# Patient Record
Sex: Female | Born: 1985 | Race: White | Hispanic: No | Marital: Single | Smoking: Current every day smoker
Health system: Southern US, Community
[De-identification: ages and names within clinical notes are randomized; demographics above are authoritative.]

## PROBLEM LIST (undated history)

## (undated) DIAGNOSIS — K8020 Calculus of gallbladder without cholecystitis without obstruction: Secondary | ICD-10-CM

## (undated) DIAGNOSIS — F32A Depression, unspecified: Secondary | ICD-10-CM

## (undated) DIAGNOSIS — F419 Anxiety disorder, unspecified: Secondary | ICD-10-CM

## (undated) DIAGNOSIS — F329 Major depressive disorder, single episode, unspecified: Secondary | ICD-10-CM

## (undated) HISTORY — PX: EAR TUBE REMOVAL: SHX1486

---

## 2013-10-25 ENCOUNTER — Other Ambulatory Visit: Payer: Self-pay | Admitting: *Deleted

## 2013-10-25 ENCOUNTER — Other Ambulatory Visit: Payer: Self-pay | Admitting: Obstetrics & Gynecology

## 2013-10-25 ENCOUNTER — Ambulatory Visit (INDEPENDENT_AMBULATORY_CARE_PROVIDER_SITE_OTHER): Payer: 59 | Admitting: Obstetrics & Gynecology

## 2013-10-25 ENCOUNTER — Encounter: Payer: Self-pay | Admitting: Obstetrics & Gynecology

## 2013-10-25 VITALS — BP 119/74 | HR 77 | Ht 63.0 in | Wt 138.0 lb

## 2013-10-25 DIAGNOSIS — O26899 Other specified pregnancy related conditions, unspecified trimester: Secondary | ICD-10-CM

## 2013-10-25 DIAGNOSIS — Z3490 Encounter for supervision of normal pregnancy, unspecified, unspecified trimester: Secondary | ICD-10-CM | POA: Insufficient documentation

## 2013-10-25 DIAGNOSIS — Z3480 Encounter for supervision of other normal pregnancy, unspecified trimester: Secondary | ICD-10-CM

## 2013-10-25 DIAGNOSIS — O3090 Multiple gestation, unspecified, unspecified trimester: Secondary | ICD-10-CM

## 2013-10-25 DIAGNOSIS — O360111 Maternal care for anti-D [Rh] antibodies, first trimester, fetus 1: Secondary | ICD-10-CM

## 2013-10-25 DIAGNOSIS — Z6791 Unspecified blood type, Rh negative: Secondary | ICD-10-CM | POA: Insufficient documentation

## 2013-10-25 DIAGNOSIS — Z3491 Encounter for supervision of normal pregnancy, unspecified, first trimester: Secondary | ICD-10-CM

## 2013-10-25 DIAGNOSIS — Z3481 Encounter for supervision of other normal pregnancy, first trimester: Secondary | ICD-10-CM

## 2013-10-25 DIAGNOSIS — O360990 Maternal care for other rhesus isoimmunization, unspecified trimester, not applicable or unspecified: Secondary | ICD-10-CM

## 2013-10-25 NOTE — Patient Instructions (Addendum)
First Trimester of Pregnancy The first trimester of pregnancy is from week 1 until the end of week 12 (months 1 through 3). A week after a sperm fertilizes an egg, the egg will implant on the wall of the uterus. This embryo will begin to develop into a baby. Genes from you and your partner are forming the baby. The female genes determine whether the baby is a boy or a girl. At 6-8 weeks, the eyes and face are formed, and the heartbeat can be seen on ultrasound. At the end of 12 weeks, all the baby's organs are formed.  Now that you are pregnant, you will want to do everything you can to have a healthy baby. Two of the most important things are to get good prenatal care and to follow your health care provider's instructions. Prenatal care is all the medical care you receive before the baby's birth. This care will help prevent, find, and treat any problems during the pregnancy and childbirth. BODY CHANGES Your body goes through many changes during pregnancy. The changes vary from woman to woman.   You may gain or lose a couple of pounds at first.  You may feel sick to your stomach (nauseous) and throw up (vomit). If the vomiting is uncontrollable, call your health care provider.  You may tire easily.  You may develop headaches that can be relieved by medicines approved by your health care provider.  You may urinate more often. Painful urination may mean you have a bladder infection.  You may develop heartburn as a result of your pregnancy.  You may develop constipation because certain hormones are causing the muscles that push waste through your intestines to slow down.  You may develop hemorrhoids or swollen, bulging veins (varicose veins).  Your breasts may begin to grow larger and become tender. Your nipples may stick out more, and the tissue that surrounds them (areola) may become darker.  Your gums may bleed and may be sensitive to brushing and flossing.  Dark spots or blotches  (chloasma, mask of pregnancy) may develop on your face. This will likely fade after the baby is born.  Your menstrual periods will stop.  You may have a loss of appetite.  You may develop cravings for certain kinds of food.  You may have changes in your emotions from day to day, such as being excited to be pregnant or being concerned that something may go wrong with the pregnancy and baby.  You may have more vivid and strange dreams.  You may have changes in your hair. These can include thickening of your hair, rapid growth, and changes in texture. Some women also have hair loss during or after pregnancy, or hair that feels dry or thin. Your hair will most likely return to normal after your baby is born. WHAT TO EXPECT AT YOUR PRENATAL VISITS During a routine prenatal visit:  You will be weighed to make sure you and the baby are growing normally.  Your blood pressure will be taken.  Your abdomen will be measured to track your baby's growth.  The fetal heartbeat will be listened to starting around week 10 or 12 of your pregnancy.  Test results from any previous visits will be discussed. Your health care provider may ask you:  How you are feeling.  If you are feeling the baby move.  If you have had any abnormal symptoms, such as leaking fluid, bleeding, severe headaches, or abdominal cramping.  If you have any questions. Other tests   that may be performed during your first trimester include:  Blood tests to find your blood type and to check for the presence of any previous infections. They will also be used to check for low iron levels (anemia) and Rh antibodies. Later in the pregnancy, blood tests for diabetes will be done along with other tests if problems develop.  Urine tests to check for infections, diabetes, or protein in the urine.  An ultrasound to confirm the proper growth and development of the baby.  An amniocentesis to check for possible genetic problems.  Fetal  screens for spina bifida and Down syndrome.  You may need other tests to make sure you and the baby are doing well. HOME CARE INSTRUCTIONS  Medicines  Follow your health care provider's instructions regarding medicine use. Specific medicines may be either safe or unsafe to take during pregnancy.  Take your prenatal vitamins as directed.  If you develop constipation, try taking a stool softener if your health care provider approves. Diet  Eat regular, well-balanced meals. Choose a variety of foods, such as meat or vegetable-based protein, fish, milk and low-fat dairy products, vegetables, fruits, and whole grain breads and cereals. Your health care provider will help you determine the amount of weight gain that is right for you.  Avoid raw meat and uncooked cheese. These carry germs that can cause birth defects in the baby.  Eating four or five small meals rather than three large meals a day may help relieve nausea and vomiting. If you start to feel nauseous, eating a few soda crackers can be helpful. Drinking liquids between meals instead of during meals also seems to help nausea and vomiting.  If you develop constipation, eat more high-fiber foods, such as fresh vegetables or fruit and whole grains. Drink enough fluids to keep your urine clear or pale yellow. Activity and Exercise  Exercise only as directed by your health care provider. Exercising will help you:  Control your weight.  Stay in shape.  Be prepared for labor and delivery.  Experiencing pain or cramping in the lower abdomen or low back is a good sign that you should stop exercising. Check with your health care provider before continuing normal exercises.  Try to avoid standing for long periods of time. Move your legs often if you must stand in one place for a long time.  Avoid heavy lifting.  Wear low-heeled shoes, and practice good posture.  You may continue to have sex unless your health care provider directs you  otherwise. Relief of Pain or Discomfort  Wear a good support bra for breast tenderness.   Take warm sitz baths to soothe any pain or discomfort caused by hemorrhoids. Use hemorrhoid cream if your health care provider approves.   Rest with your legs elevated if you have leg cramps or low back pain.  If you develop varicose veins in your legs, wear support hose. Elevate your feet for 15 minutes, 3-4 times a day. Limit salt in your diet. Prenatal Care  Schedule your prenatal visits by the twelfth week of pregnancy. They are usually scheduled monthly at first, then more often in the last 2 months before delivery.  Write down your questions. Take them to your prenatal visits.  Keep all your prenatal visits as directed by your health care provider. Safety  Wear your seat belt at all times when driving.  Make a list of emergency phone numbers, including numbers for family, friends, the hospital, and police and fire departments. General Tips    Ask your health care provider for a referral to a local prenatal education class. Begin classes no later than at the beginning of month 6 of your pregnancy.  Ask for help if you have counseling or nutritional needs during pregnancy. Your health care provider can offer advice or refer you to specialists for help with various needs.  Do not use hot tubs, steam rooms, or saunas.  Do not douche or use tampons or scented sanitary pads.  Do not cross your legs for long periods of time.  Avoid cat litter boxes and soil used by cats. These carry germs that can cause birth defects in the baby and possibly loss of the fetus by miscarriage or stillbirth.  Avoid all smoking, herbs, alcohol, and medicines not prescribed by your health care provider. Chemicals in these affect the formation and growth of the baby.  Schedule a dentist appointment. At home, brush your teeth with a soft toothbrush and be gentle when you floss. SEEK MEDICAL CARE IF:   You have  dizziness.  You have mild pelvic cramps, pelvic pressure, or nagging pain in the abdominal area.  You have persistent nausea, vomiting, or diarrhea.  You have a bad smelling vaginal discharge.  You have pain with urination.  You notice increased swelling in your face, hands, legs, or ankles. SEEK IMMEDIATE MEDICAL CARE IF:   You have a fever.  You are leaking fluid from your vagina.  You have spotting or bleeding from your vagina.  You have severe abdominal cramping or pain.  You have rapid weight gain or loss.  You vomit blood or material that looks like coffee grounds.  You are exposed to German measles and have never had them.  You are exposed to fifth disease or chickenpox.  You develop a severe headache.  You have shortness of breath.  You have any kind of trauma, such as from a fall or a car accident. Document Released: 01/20/2001 Document Revised: 06/12/2013 Document Reviewed: 12/06/2012 [employer] Patient Information 2015 [employer]. This information is not intended to replace advice given to you by your health care provider. Make sure you discuss any questions you have with your health care provider.  Contraception Choices Contraception (birth control) is the use of any methods or devices to prevent pregnancy. Below are some methods to help avoid pregnancy. HORMONAL METHODS   Contraceptive implant. This is a thin, plastic tube containing progesterone hormone. It does not contain estrogen hormone. Your health care provider inserts the tube in the inner part of the upper arm. The tube can remain in place for up to 3 years. After 3 years, the implant must be removed. The implant prevents the ovaries from releasing an egg (ovulation), thickens the cervical mucus to prevent sperm from entering the uterus, and thins the lining of the inside of the uterus.  Progesterone-only injections. These injections are given every 3 months by your health care provider to prevent  pregnancy. This synthetic progesterone hormone stops the ovaries from releasing eggs. It also thickens cervical mucus and changes the uterine lining. This makes it harder for sperm to survive in the uterus.  Birth control pills. These pills contain estrogen and progesterone hormone. They work by preventing the ovaries from releasing eggs (ovulation). They also cause the cervical mucus to thicken, preventing the sperm from entering the uterus. Birth control pills are prescribed by a health care provider.Birth control pills can also be used to treat heavy periods.  Minipill. This type of birth control pill contains   only the progesterone hormone. They are taken every day of each month and must be prescribed by your health care provider.  Birth control patch. The patch contains hormones similar to those in birth control pills. It must be changed once a week and is prescribed by a health care provider.  Vaginal ring. The ring contains hormones similar to those in birth control pills. It is left in the vagina for 3 weeks, removed for 1 week, and then a new one is put back in place. The patient must be comfortable inserting and removing the ring from the vagina.A health care provider's prescription is necessary.  Emergency contraception. Emergency contraceptives prevent pregnancy after unprotected sexual intercourse. This pill can be taken right after sex or up to 5 days after unprotected sex. It is most effective the sooner you take the pills after having sexual intercourse. Most emergency contraceptive pills are available without a prescription. Check with your pharmacist. Do not use emergency contraception as your only form of birth control. BARRIER METHODS   Female condom. This is a thin sheath (latex or rubber) that is worn over the penis during sexual intercourse. It can be used with spermicide to increase effectiveness.  Female condom. This is a soft, loose-fitting sheath that is put into the vagina  before sexual intercourse.  Diaphragm. This is a soft, latex, dome-shaped barrier that must be fitted by a health care provider. It is inserted into the vagina, along with a spermicidal jelly. It is inserted before intercourse. The diaphragm should be left in the vagina for 6 to 8 hours after intercourse.  Cervical cap. This is a round, soft, latex or plastic cup that fits over the cervix and must be fitted by a health care provider. The cap can be left in place for up to 48 hours after intercourse.  Sponge. This is a soft, circular piece of polyurethane foam. The sponge has spermicide in it. It is inserted into the vagina after wetting it and before sexual intercourse.  Spermicides. These are chemicals that kill or block sperm from entering the cervix and uterus. They come in the form of creams, jellies, suppositories, foam, or tablets. They do not require a prescription. They are inserted into the vagina with an applicator before having sexual intercourse. The process must be repeated every time you have sexual intercourse. INTRAUTERINE CONTRACEPTION  Intrauterine device (IUD). This is a T-shaped device that is put in a woman's uterus during a menstrual period to prevent pregnancy. There are 2 types:  Copper IUD. This type of IUD is wrapped in copper wire and is placed inside the uterus. Copper makes the uterus and fallopian tubes produce a fluid that kills sperm. It can stay in place for 10 years.  Hormone IUD. This type of IUD contains the hormone progestin (synthetic progesterone). The hormone thickens the cervical mucus and prevents sperm from entering the uterus, and it also thins the uterine lining to prevent implantation of a fertilized egg. The hormone can weaken or kill the sperm that get into the uterus. It can stay in place for 3-5 years, depending on which type of IUD is used. PERMANENT METHODS OF CONTRACEPTION  Female tubal ligation. This is when the woman's fallopian tubes are  surgically sealed, tied, or blocked to prevent the egg from traveling to the uterus.  Hysteroscopic sterilization. This involves placing a small coil or insert into each fallopian tube. Your doctor uses a technique called hysteroscopy to do the procedure. The device causes scar tissue   to form. This results in permanent blockage of the fallopian tubes, so the sperm cannot fertilize the egg. It takes about 3 months after the procedure for the tubes to become blocked. You must use another form of birth control for these 3 months.  Female sterilization. This is when the female has the tubes that carry sperm tied off (vasectomy).This blocks sperm from entering the vagina during sexual intercourse. After the procedure, the man can still ejaculate fluid (semen). NATURAL PLANNING METHODS  Natural family planning. This is not having sexual intercourse or using a barrier method (condom, diaphragm, cervical cap) on days the woman could become pregnant.  Calendar method. This is keeping track of the length of each menstrual cycle and identifying when you are fertile.  Ovulation method. This is avoiding sexual intercourse during ovulation.  Symptothermal method. This is avoiding sexual intercourse during ovulation, using a thermometer and ovulation symptoms.  Post-ovulation method. This is timing sexual intercourse after you have ovulated. Regardless of which type or method of contraception you choose, it is important that you use condoms to protect against the transmission of sexually transmitted infections (STIs). Talk with your health care provider about which form of contraception is most appropriate for you. Document Released: 01/26/2005 Document Revised: 01/31/2013 Document Reviewed: 07/21/2012 [facility] Patient Information 2015 [place], [place]. This information is not intended to replace advice given to you by your health care provider. Make sure you discuss any questions you have with your health care  provider. Thank you for enrolling in MyChart. Please follow the instructions below to securely access your online medical record. MyChart allows you to send messages to your doctor, view your test results, manage appointments, and more.   How Do I Sign Up? 1. In your Internet browser, go to [employer] and enter [email or web address]. 2. Click on the Sign Up Now link in the Sign In box. You will see the New Member Sign Up page. 3. Enter your MyChart Access Code exactly as it appears below. You will not need to use this code after you've completed the sign-up process. If you do not sign up before the expiration date, you must request a new code.  MyChart Access Code: [email or web address] Expires: 12/24/2013 10:15 AM  4. Enter your Social Security Number ([id number]) and Date of Birth (mm/dd/yyyy) as indicated and click Submit. You will be taken to the next sign-up page. 5. Create a MyChart ID. This will be your MyChart login ID and cannot be changed, so think of one that is secure and easy to remember. 6. Create a MyChart password. You can change your password at any time. 7. Enter your Password Reset Question and Answer. This can be used at a later time if you forget your password.  8. Enter your e-mail address. You will receive e-mail notification when new information is available in MyChart. 9. Click Sign Up. You can now view your medical record.   Additional Information Remember, MyChart is NOT to be used for urgent needs. For medical emergencies, dial 911.

## 2013-10-25 NOTE — Progress Notes (Signed)
Bedside U/S show single IUP - CRL: 25.77mm GA: [redacted]w[redacted]d FHR: 176bpm - Last Pap 05/05/12 normal.

## 2013-10-25 NOTE — Progress Notes (Signed)
    Subjective:    Danila Eddie is a 28 y.o. G2P0 at [redacted]w[redacted]d by clinic scan not consistent with LMP, being seen today for her first obstetrical visit.  Her obstetrical history is significant for being Rh negative, and having a term SVD in 2012 of a 6 lb 6 oz female infant. Patient does intend to breast feed. Pregnancy history fully reviewed.  Patient reports no significant complaints.  Filed Vitals:   10/25/13 0924 10/25/13 0925  BP: 119/74   Pulse: 77   Height:   (1.6 m)  Weight: 138 lb (62.596 kg)     HISTORY: OB History  Gravida Para Term Preterm AB SAB TAB Ectopic Multiple Living  2         1    # Outcome Date GA Lbr Len/2nd Weight Sex Delivery Anes PTL Lv  2 CUR           1 GRA 09/04/10 [redacted]w[redacted]d  6 lb 6 oz (2.892 kg) F SVD        History reviewed. No pertinent past medical history. Past Surgical History  Procedure Laterality Date  . Ear tube removal     Family History  Problem Relation Age of Onset  . Hypertension Mother   . Hypertension Maternal Grandmother   . Diabetes Paternal Grandmother   . Hyperlipidemia Maternal Grandmother      Exam    Uterus:     Pelvic Exam:    Perineum: No Hemorrhoids, Normal Perineum   Vulva: normal   Vagina:  normal mucosa, normal discharge   Cervix: multiparous appearance and no cervical motion tenderness   Adnexa: normal adnexa and no mass, fullness, tenderness   Bony Pelvis: average and proven to 6-6  System: Breast:  normal appearance, no masses or tenderness   Skin: normal coloration and turgor, no rashes   Neurologic: oriented, normal   Extremities: normal strength, tone, and muscle mass   HEENT PERRLA and extra ocular movement intact   Mouth/Teeth mucous membranes moist, pharynx normal without lesions and dental hygiene good   Neck supple and no masses   Cardiovascular: regular rate and rhythm   Respiratory:  appears well, vitals normal, no respiratory distress, acyanotic, normal RR, chest clear, no wheezing,  crepitations, rhonchi, normal symmetric air entry   Abdomen: soft, non-tender; bowel sounds normal; no masses,  no organomegaly   Urinary: urethral meatus normal      Assessment:    Pregnancy: G2P0 Patient Active Problem List   Diagnosis Date Noted  . Supervision of normal pregnancy 10/25/2013  . Rh negative, antepartum 10/25/2013     Plan:    Initial labs drawn. Continue prenatal vitamins. Problem list reviewed and updated. Genetic Screening discussed; she is interested in [place], information given to her.  She may get this at next visit. Ultrasound discussed; fetal survey: to be ordered later. The nature of [facility] with multiple MDs and other Advanced Practitioners was explained to patient; also emphasized that residents, students are part of our team. Follow up in 4 weeks. Routine obstetric precautions reviewed.   Tereso Newcomer, MD 10/25/2013

## 2013-10-26 ENCOUNTER — Telehealth: Payer: Self-pay | Admitting: *Deleted

## 2013-10-26 LAB — GC/CHLAMYDIA PROBE AMP
CT Probe RNA: NEGATIVE
GC Probe RNA: NEGATIVE

## 2013-10-26 LAB — OBSTETRIC PANEL
Antibody Screen: NEGATIVE
Basophils Absolute: 0 10*3/uL (ref 0.0–0.1)
Basophils Relative: 0 % (ref 0–1)
Eosinophils Absolute: 0.1 10*3/uL (ref 0.0–0.7)
Eosinophils Relative: 1 % (ref 0–5)
HCT: 38.9 % (ref 36.0–46.0)
Hemoglobin: 13 g/dL (ref 12.0–15.0)
Hepatitis B Surface Ag: NEGATIVE
Lymphocytes Relative: 14 % (ref 12–46)
Lymphs Abs: 1.4 10*3/uL (ref 0.7–4.0)
MCH: 31.8 pg (ref 26.0–34.0)
MCHC: 33.4 g/dL (ref 30.0–36.0)
MCV: 95.1 fL (ref 78.0–100.0)
Monocytes Absolute: 0.9 10*3/uL (ref 0.1–1.0)
Monocytes Relative: 9 % (ref 3–12)
Neutro Abs: 7.4 10*3/uL (ref 1.7–7.7)
Neutrophils Relative %: 76 % (ref 43–77)
Platelets: 161 10*3/uL (ref 150–400)
RBC: 4.09 MIL/uL (ref 3.87–5.11)
RDW: 13.2 % (ref 11.5–15.5)
Rh Type: NEGATIVE
Rubella: 2.04 Index — ABNORMAL HIGH (ref <0.90)
WBC: 9.8 10*3/uL (ref 4.0–10.5)

## 2013-10-26 LAB — HIV ANTIBODY (ROUTINE TESTING W REFLEX): HIV 1&2 Ab, 4th Generation: NONREACTIVE

## 2013-10-26 NOTE — Telephone Encounter (Signed)
Called pt to adv RhNeg as in previous pregnancy per pt request - Pt expressed understanding.

## 2013-10-27 LAB — CULTURE, OB URINE
Colony Count: NO GROWTH
ORGANISM ID, BACTERIA: NO GROWTH

## 2013-11-22 ENCOUNTER — Ambulatory Visit (INDEPENDENT_AMBULATORY_CARE_PROVIDER_SITE_OTHER): Payer: 59 | Admitting: Obstetrics & Gynecology

## 2013-11-22 ENCOUNTER — Encounter: Payer: Self-pay | Admitting: Obstetrics & Gynecology

## 2013-11-22 VITALS — Wt 141.0 lb

## 2013-11-22 DIAGNOSIS — Z23 Encounter for immunization: Secondary | ICD-10-CM

## 2013-11-22 DIAGNOSIS — Z3492 Encounter for supervision of normal pregnancy, unspecified, second trimester: Secondary | ICD-10-CM

## 2013-11-22 NOTE — Progress Notes (Signed)
Beside u/s shows CRL [id number]

## 2013-11-22 NOTE — Progress Notes (Signed)
Routine visit. No problems. She wants Panorama. Flu vaccine today. MSAFP at next visit.

## 2013-11-30 ENCOUNTER — Encounter: Payer: Self-pay | Admitting: *Deleted

## 2013-12-04 ENCOUNTER — Encounter: Payer: Self-pay | Admitting: Obstetrics & Gynecology

## 2013-12-12 ENCOUNTER — Encounter: Payer: Self-pay | Admitting: Obstetrics & Gynecology

## 2013-12-27 ENCOUNTER — Ambulatory Visit (INDEPENDENT_AMBULATORY_CARE_PROVIDER_SITE_OTHER): Payer: 59 | Admitting: Obstetrics & Gynecology

## 2013-12-27 ENCOUNTER — Encounter: Payer: Self-pay | Admitting: Obstetrics & Gynecology

## 2013-12-27 VITALS — BP 115/67 | HR 82 | Wt 142.0 lb

## 2013-12-27 DIAGNOSIS — Z36 Encounter for antenatal screening of mother: Secondary | ICD-10-CM

## 2013-12-27 DIAGNOSIS — Z3492 Encounter for supervision of normal pregnancy, unspecified, second trimester: Secondary | ICD-10-CM

## 2013-12-27 DIAGNOSIS — Z3482 Encounter for supervision of other normal pregnancy, second trimester: Secondary | ICD-10-CM

## 2013-12-27 NOTE — Progress Notes (Signed)
Routine visit. Good FM! No problems. AFP today. Schedule anatomy scan.

## 2013-12-28 LAB — ALPHA FETOPROTEIN, MATERNAL
AFP: 32.7 ng/mL
Curr Gest Age: 18.2 wks.days
MOM FOR AFP: 0.69
Open Spina bifida: NEGATIVE
Osb Risk: <1:27300 {titer}

## 2014-01-10 ENCOUNTER — Ambulatory Visit (HOSPITAL_COMMUNITY)
Admission: RE | Admit: 2014-01-10 | Discharge: 2014-01-10 | Disposition: A | Discharge status: Home / Self Care | Payer: 59 | Source: Ambulatory Visit | Attending: Obstetrics & Gynecology | Admitting: Obstetrics & Gynecology

## 2014-01-10 DIAGNOSIS — Z36 Encounter for antenatal screening of mother: Secondary | ICD-10-CM | POA: Diagnosis present

## 2014-01-10 DIAGNOSIS — Z3A20 20 weeks gestation of pregnancy: Secondary | ICD-10-CM | POA: Diagnosis not present

## 2014-01-10 DIAGNOSIS — Z3492 Encounter for supervision of normal pregnancy, unspecified, second trimester: Secondary | ICD-10-CM

## 2014-01-24 ENCOUNTER — Ambulatory Visit (INDEPENDENT_AMBULATORY_CARE_PROVIDER_SITE_OTHER): Payer: 59 | Admitting: Obstetrics & Gynecology

## 2014-01-24 VITALS — BP 103/67 | HR 68 | Wt 145.0 lb

## 2014-01-24 DIAGNOSIS — Z3492 Encounter for supervision of normal pregnancy, unspecified, second trimester: Secondary | ICD-10-CM

## 2014-01-24 NOTE — Progress Notes (Signed)
No concerns.  Nml anatomy.  Dates by early US.

## 2014-02-21 ENCOUNTER — Ambulatory Visit (INDEPENDENT_AMBULATORY_CARE_PROVIDER_SITE_OTHER): Payer: 59 | Admitting: Obstetrics & Gynecology

## 2014-02-21 VITALS — BP 111/70 | HR 73 | Wt 151.0 lb

## 2014-02-21 DIAGNOSIS — O360931 Maternal care for other rhesus isoimmunization, third trimester, fetus 1: Secondary | ICD-10-CM

## 2014-02-21 DIAGNOSIS — Z3490 Encounter for supervision of normal pregnancy, unspecified, unspecified trimester: Secondary | ICD-10-CM

## 2014-02-21 DIAGNOSIS — Z6741 Type O blood, Rh negative: Secondary | ICD-10-CM

## 2014-02-21 DIAGNOSIS — Z23 Encounter for immunization: Secondary | ICD-10-CM

## 2014-02-21 MED ORDER — TETANUS-DIPHTH-ACELL PERTUSSIS 5-2.5-18.5 LF-MCG/0.5 IM SUSP
0.5000 mL | Freq: Once | INTRAMUSCULAR | Status: AC
  Administered 2014-02-21: 0.5 mL via INTRAMUSCULAR

## 2014-02-21 NOTE — Progress Notes (Signed)
Routine visit. Good FM. Had spotting after sex, also has occasional nose bleeds. Cervix appears totally normal with no blood. Closed/thick/high. Rhogam today, 28 week labs at next visit along with TDAP.

## 2014-02-21 NOTE — Progress Notes (Signed)
Spotting after intercourse last night

## 2014-02-26 ENCOUNTER — Encounter (INDEPENDENT_AMBULATORY_CARE_PROVIDER_SITE_OTHER): Payer: Medicaid Other | Admitting: *Deleted

## 2014-02-26 DIAGNOSIS — Z3492 Encounter for supervision of normal pregnancy, unspecified, second trimester: Secondary | ICD-10-CM

## 2014-03-14 ENCOUNTER — Ambulatory Visit (INDEPENDENT_AMBULATORY_CARE_PROVIDER_SITE_OTHER): Payer: 59 | Admitting: Obstetrics & Gynecology

## 2014-03-14 ENCOUNTER — Other Ambulatory Visit: Payer: Self-pay | Admitting: Obstetrics & Gynecology

## 2014-03-14 VITALS — BP 119/70 | HR 95 | Wt 154.0 lb

## 2014-03-14 DIAGNOSIS — Z23 Encounter for immunization: Secondary | ICD-10-CM

## 2014-03-14 DIAGNOSIS — Z3493 Encounter for supervision of normal pregnancy, unspecified, third trimester: Secondary | ICD-10-CM

## 2014-03-14 DIAGNOSIS — Z36 Encounter for antenatal screening of mother: Secondary | ICD-10-CM

## 2014-03-14 LAB — CBC
HEMATOCRIT: 34.1 % — AB (ref 36.0–46.0)
Hemoglobin: 11.6 g/dL — ABNORMAL LOW (ref 12.0–15.0)
MCH: 32.5 pg (ref 26.0–34.0)
MCHC: 34 g/dL (ref 30.0–36.0)
MCV: 95.5 fL (ref 78.0–100.0)
MPV: 10 fL (ref 8.6–12.4)
Platelets: 150 10*3/uL (ref 150–400)
RBC: 3.57 MIL/uL — ABNORMAL LOW (ref 3.87–5.11)
RDW: 13 % (ref 11.5–15.5)
WBC: 9.7 10*3/uL (ref 4.0–10.5)

## 2014-03-14 MED ORDER — DTAP-HEPATITIS B RECOMB-IPV IM SUSP: 0.5000 mL | Freq: Once | INTRAMUSCULAR | Status: DC

## 2014-03-14 MED ORDER — TETANUS-DIPHTH-ACELL PERTUSSIS 5-2.5-18.5 LF-MCG/0.5 IM SUSP
0.5000 mL | Freq: Once | INTRAMUSCULAR | Status: AC
  Administered 2014-03-14: 0.5 mL via INTRAMUSCULAR

## 2014-03-15 ENCOUNTER — Telehealth: Payer: Self-pay | Admitting: *Deleted

## 2014-03-15 ENCOUNTER — Other Ambulatory Visit: Payer: Self-pay | Admitting: *Deleted

## 2014-03-15 DIAGNOSIS — Z3493 Encounter for supervision of normal pregnancy, unspecified, third trimester: Secondary | ICD-10-CM

## 2014-03-15 LAB — RPR

## 2014-03-15 LAB — HIV ANTIBODY (ROUTINE TESTING W REFLEX): HIV: NONREACTIVE

## 2014-03-15 LAB — GLUCOSE TOLERANCE, 1 HOUR (50G) W/O FASTING: GLUCOSE 1 HOUR GTT: 85 mg/dL (ref 70–140)

## 2014-03-15 NOTE — Telephone Encounter (Signed)
LM on voicemail of normal 1 hr GTT. 

## 2014-03-16 LAB — ANTIBODY SCREEN: ANTIBODY SCREEN: NEGATIVE

## 2014-03-16 MED ORDER — RHO D IMMUNE GLOBULIN 1500 UNIT/2ML IJ SOSY
300.0000 ug | PREFILLED_SYRINGE | Freq: Once | INTRAMUSCULAR | Status: AC
  Administered 2014-02-21: 300 ug via INTRAMUSCULAR

## 2014-03-16 NOTE — Addendum Note (Signed)
Addended by: Granville LewisLARK, Laniece Hornbaker L on: 03/16/2014 07:40 AM   Modules accepted: Orders

## 2014-03-28 ENCOUNTER — Encounter: Payer: 59 | Admitting: Obstetrics & Gynecology

## 2014-03-30 ENCOUNTER — Ambulatory Visit (INDEPENDENT_AMBULATORY_CARE_PROVIDER_SITE_OTHER): Payer: 59 | Admitting: Advanced Practice Midwife

## 2014-03-30 VITALS — BP 111/79 | HR 81 | Wt 155.0 lb

## 2014-03-30 DIAGNOSIS — O360111 Maternal care for anti-D [Rh] antibodies, first trimester, fetus 1: Secondary | ICD-10-CM

## 2014-03-30 DIAGNOSIS — Z3493 Encounter for supervision of normal pregnancy, unspecified, third trimester: Secondary | ICD-10-CM

## 2014-03-30 NOTE — Patient Instructions (Signed)
Third Trimester of Pregnancy The third trimester is from week 29 through week 42, months 7 through 9. The third trimester is a time when the fetus is growing rapidly. At the end of the ninth month, the fetus is about 20 inches in length and weighs 6-10 pounds.  BODY CHANGES Your body goes through many changes during pregnancy. The changes vary from woman to woman.   Your weight will continue to increase. You can expect to gain 25-35 pounds (11-16 kg) by the end of the pregnancy.  You may begin to get stretch marks on your hips, abdomen, and breasts.  You may urinate more often because the fetus is moving lower into your pelvis and pressing on your bladder.  You may develop or continue to have heartburn as a result of your pregnancy.  You may develop constipation because certain hormones are causing the muscles that push waste through your intestines to slow down.  You may develop hemorrhoids or swollen, bulging veins (varicose veins).  You may have pelvic pain because of the weight gain and pregnancy hormones relaxing your joints between the bones in your pelvis. Backaches may result from overexertion of the muscles supporting your posture.  You may have changes in your hair. These can include thickening of your hair, rapid growth, and changes in texture. Some women also have hair loss during or after pregnancy, or hair that feels dry or thin. Your hair will most likely return to normal after your baby is born.  Your breasts will continue to grow and be tender. A yellow discharge may leak from your breasts called colostrum.  Your belly button may stick out.  You may feel short of breath because of your expanding uterus.  You may notice the fetus "dropping," or moving lower in your abdomen.  You may have a bloody mucus discharge. This usually occurs a few days to a week before labor begins.  Your cervix becomes thin and soft (effaced) near your due date. WHAT TO EXPECT AT YOUR PRENATAL  EXAMS  You will have prenatal exams every 2 weeks until week 36. Then, you will have weekly prenatal exams. During a routine prenatal visit:  You will be weighed to make sure you and the fetus are growing normally.  Your blood pressure is taken.  Your abdomen will be measured to track your baby's growth.  The fetal heartbeat will be listened to.  Any test results from the previous visit will be discussed.  You may have a cervical check near your due date to see if you have effaced. At around 36 weeks, your caregiver will check your cervix. At the same time, your caregiver will also perform a test on the secretions of the vaginal tissue. This test is to determine if a type of bacteria, Group B streptococcus, is present. Your caregiver will explain this further. Your caregiver may ask you:  What your birth plan is.  How you are feeling.  If you are feeling the baby move.  If you have had any abnormal symptoms, such as leaking fluid, bleeding, severe headaches, or abdominal cramping.  If you have any questions. Other tests or screenings that may be performed during your third trimester include:  Blood tests that check for low iron levels (anemia).  Fetal testing to check the health, activity level, and growth of the fetus. Testing is done if you have certain medical conditions or if there are problems during the pregnancy. FALSE LABOR You may feel small, irregular contractions that   eventually go away. These are called Braxton Hicks contractions, or false labor. Contractions may last for hours, days, or even weeks before true labor sets in. If contractions come at regular intervals, intensify, or become painful, it is best to be seen by your caregiver.  SIGNS OF LABOR   Menstrual-like cramps.  Contractions that are 5 minutes apart or less.  Contractions that start on the top of the uterus and spread down to the lower abdomen and back.  A sense of increased pelvic pressure or back  pain.  A watery or bloody mucus discharge that comes from the vagina. If you have any of these signs before the 37th week of pregnancy, call your caregiver right away. You need to go to the hospital to get checked immediately. HOME CARE INSTRUCTIONS   Avoid all smoking, herbs, alcohol, and unprescribed drugs. These chemicals affect the formation and growth of the baby.  Follow your caregiver's instructions regarding medicine use. There are medicines that are either safe or unsafe to take during pregnancy.  Exercise only as directed by your caregiver. Experiencing uterine cramps is a good sign to stop exercising.  Continue to eat regular, healthy meals.  Wear a good support bra for breast tenderness.  Do not use hot tubs, steam rooms, or saunas.  Wear your seat belt at all times when driving.  Avoid raw meat, uncooked cheese, cat litter boxes, and soil used by cats. These carry germs that can cause birth defects in the baby.  Take your prenatal vitamins.  Try taking a stool softener (if your caregiver approves) if you develop constipation. Eat more high-fiber foods, such as fresh vegetables or fruit and whole grains. Drink plenty of fluids to keep your urine clear or pale yellow.  Take warm sitz baths to soothe any pain or discomfort caused by hemorrhoids. Use hemorrhoid cream if your caregiver approves.  If you develop varicose veins, wear support hose. Elevate your feet for 15 minutes, 3-4 times a day. Limit salt in your diet.  Avoid heavy lifting, wear low heal shoes, and practice good posture.  Rest a lot with your legs elevated if you have leg cramps or low back pain.  Visit your dentist if you have not gone during your pregnancy. Use a soft toothbrush to brush your teeth and be gentle when you floss.  A sexual relationship may be continued unless your caregiver directs you otherwise.  Do not travel far distances unless it is absolutely necessary and only with the approval  of your caregiver.  Take prenatal classes to understand, practice, and ask questions about the labor and delivery.  Make a trial run to the hospital.  Pack your hospital bag.  Prepare the baby's nursery.  Continue to go to all your prenatal visits as directed by your caregiver. SEEK MEDICAL CARE IF:  You are unsure if you are in labor or if your water has broken.  You have dizziness.  You have mild pelvic cramps, pelvic pressure, or nagging pain in your abdominal area.  You have persistent nausea, vomiting, or diarrhea.  You have a bad smelling vaginal discharge.  You have pain with urination. SEEK IMMEDIATE MEDICAL CARE IF:   You have a fever.  You are leaking fluid from your vagina.  You have spotting or bleeding from your vagina.  You have severe abdominal cramping or pain.  You have rapid weight loss or gain.  You have shortness of breath with chest pain.  You notice sudden or extreme swelling   of your face, hands, ankles, feet, or legs.  You have not felt your baby move in over an hour.  You have severe headaches that do not go away with medicine.  You have vision changes. Document Released: 01/20/2001 Document Revised: 01/31/2013 Document Reviewed: 03/29/2012 Exit[employer] Patient Information 2015 [employer]. This information is not intended to replace advice given to you by your health care provider. Make sure you discuss any questions you have with your health care provider.  

## 2014-03-30 NOTE — Progress Notes (Signed)
Doing well. No further spotting. Glucola = 85

## 2014-04-02 ENCOUNTER — Encounter: Payer: Self-pay | Admitting: *Deleted

## 2014-04-16 ENCOUNTER — Ambulatory Visit (INDEPENDENT_AMBULATORY_CARE_PROVIDER_SITE_OTHER): Payer: 59 | Admitting: Obstetrics & Gynecology

## 2014-04-16 VITALS — BP 110/71 | HR 106 | Wt 155.0 lb

## 2014-04-16 DIAGNOSIS — Z3493 Encounter for supervision of normal pregnancy, unspecified, third trimester: Secondary | ICD-10-CM | POA: Diagnosis not present

## 2014-04-16 NOTE — Progress Notes (Signed)
Having allergy symptoms.  Discussed claritin.Cultures next visit.  Needs waterbirth consent.

## 2014-04-29 ENCOUNTER — Inpatient Hospital Stay (HOSPITAL_COMMUNITY)
Admission: AD | Admit: 2014-04-29 | Discharge: 2014-05-01 | DRG: 775 | Disposition: A | Discharge status: Home / Self Care | Payer: 59 | Source: Ambulatory Visit | Attending: Obstetrics and Gynecology | Admitting: Obstetrics and Gynecology

## 2014-04-29 ENCOUNTER — Encounter (HOSPITAL_COMMUNITY): Payer: Self-pay | Admitting: *Deleted

## 2014-04-29 DIAGNOSIS — Z87891 Personal history of nicotine dependence: Secondary | ICD-10-CM

## 2014-04-29 DIAGNOSIS — Z8249 Family history of ischemic heart disease and other diseases of the circulatory system: Secondary | ICD-10-CM | POA: Diagnosis not present

## 2014-04-29 DIAGNOSIS — Z3A35 35 weeks gestation of pregnancy: Secondary | ICD-10-CM | POA: Diagnosis present

## 2014-04-29 DIAGNOSIS — Z833 Family history of diabetes mellitus: Secondary | ICD-10-CM

## 2014-04-29 LAB — CBC
HCT: 34.2 % — ABNORMAL LOW (ref 36.0–46.0)
Hemoglobin: 12 g/dL (ref 12.0–15.0)
MCH: 32.3 pg (ref 26.0–34.0)
MCHC: 35.1 g/dL (ref 30.0–36.0)
MCV: 91.9 fL (ref 78.0–100.0)
PLATELETS: 152 10*3/uL (ref 150–400)
RBC: 3.72 MIL/uL — AB (ref 3.87–5.11)
RDW: 12.8 % (ref 11.5–15.5)
WBC: 17 10*3/uL — AB (ref 4.0–10.5)

## 2014-04-29 LAB — TYPE AND SCREEN
ABO/RH(D): O NEG
Antibody Screen: NEGATIVE

## 2014-04-29 LAB — ABO/RH: ABO/RH(D): O NEG

## 2014-04-29 MED ORDER — BENZOCAINE-MENTHOL 20-0.5 % EX AERO: 1.0000 "application " | INHALATION_SPRAY | CUTANEOUS | Status: DC | PRN

## 2014-04-29 MED ORDER — SODIUM CHLORIDE 0.9 % IV SOLN: 250.0000 mL | INTRAVENOUS | Status: DC | PRN

## 2014-04-29 MED ORDER — LANOLIN HYDROUS EX OINT: TOPICAL_OINTMENT | CUTANEOUS | Status: DC | PRN

## 2014-04-29 MED ORDER — ONDANSETRON HCL 4 MG PO TABS: 4.0000 mg | ORAL_TABLET | ORAL | Status: DC | PRN

## 2014-04-29 MED ORDER — DIPHENHYDRAMINE HCL 25 MG PO CAPS: 25.0000 mg | ORAL_CAPSULE | Freq: Four times a day (QID) | ORAL | Status: DC | PRN

## 2014-04-29 MED ORDER — ACETAMINOPHEN 325 MG PO TABS: 650.0000 mg | ORAL_TABLET | ORAL | Status: DC | PRN

## 2014-04-29 MED ORDER — OXYTOCIN 40 UNITS IN LACTATED RINGERS INFUSION - SIMPLE MED: 62.5000 mL/h | INTRAVENOUS | Status: DC | PRN

## 2014-04-29 MED ORDER — TETANUS-DIPHTH-ACELL PERTUSSIS 5-2.5-18.5 LF-MCG/0.5 IM SUSP: 0.5000 mL | Freq: Once | INTRAMUSCULAR | Status: DC

## 2014-04-29 MED ORDER — IBUPROFEN 600 MG PO TABS
600.0000 mg | ORAL_TABLET | Freq: Four times a day (QID) | ORAL | Status: DC
  Administered 2014-04-29 – 2014-05-01 (×8): 600 mg via ORAL
  Filled 2014-04-29 (×7): qty 1

## 2014-04-29 MED ORDER — SIMETHICONE 80 MG PO CHEW
80.0000 mg | CHEWABLE_TABLET | ORAL | Status: DC | PRN
Start: 2014-04-29 — End: 2014-05-01

## 2014-04-29 MED ORDER — SENNOSIDES-DOCUSATE SODIUM 8.6-50 MG PO TABS
2.0000 | ORAL_TABLET | ORAL | Status: DC
  Administered 2014-04-29 – 2014-05-01 (×2): 2 via ORAL
  Filled 2014-04-29 (×2): qty 2

## 2014-04-29 MED ORDER — PRENATAL MULTIVITAMIN CH
1.0000 | ORAL_TABLET | Freq: Every day | ORAL | Status: DC
  Administered 2014-04-30: 1 via ORAL
  Filled 2014-04-29: qty 1

## 2014-04-29 MED ORDER — ONDANSETRON HCL 4 MG/2ML IJ SOLN
4.0000 mg | INTRAMUSCULAR | Status: DC | PRN
Start: 2014-04-29 — End: 2014-05-01

## 2014-04-29 MED ORDER — IBUPROFEN 600 MG PO TABS
600.0000 mg | ORAL_TABLET | Freq: Once | ORAL | Status: AC
  Administered 2014-04-29: 600 mg via ORAL
  Filled 2014-04-29: qty 1

## 2014-04-29 MED ORDER — SODIUM CHLORIDE 0.9 % IJ SOLN
3.0000 mL | Freq: Two times a day (BID) | INTRAMUSCULAR | Status: DC
Start: 2014-04-29 — End: 2014-05-01

## 2014-04-29 MED ORDER — OXYCODONE-ACETAMINOPHEN 5-325 MG PO TABS: 1.0000 | ORAL_TABLET | ORAL | Status: DC | PRN

## 2014-04-29 MED ORDER — OXYCODONE-ACETAMINOPHEN 5-325 MG PO TABS: 2.0000 | ORAL_TABLET | ORAL | Status: DC | PRN

## 2014-04-29 MED ORDER — WITCH HAZEL-GLYCERIN EX PADS: 1.0000 "application " | MEDICATED_PAD | CUTANEOUS | Status: DC | PRN

## 2014-04-29 MED ORDER — SODIUM CHLORIDE 0.9 % IJ SOLN: 3.0000 mL | INTRAMUSCULAR | Status: DC | PRN

## 2014-04-29 MED ORDER — DIBUCAINE 1 % RE OINT: 1.0000 "application " | TOPICAL_OINTMENT | RECTAL | Status: DC | PRN

## 2014-04-29 MED ORDER — OXYTOCIN 10 UNIT/ML IJ SOLN
10.0000 [IU] | Freq: Once | INTRAMUSCULAR | Status: AC
  Administered 2014-04-29: 10 [IU] via INTRAMUSCULAR
  Filled 2014-04-29 (×2): qty 1

## 2014-04-29 MED ORDER — ZOLPIDEM TARTRATE 5 MG PO TABS: 5.0000 mg | ORAL_TABLET | Freq: Every evening | ORAL | Status: DC | PRN

## 2014-04-29 NOTE — H&P (Signed)
Laurie Salinas is a 29 y.o. female presenting for birth in car on way to hospital. Arrived with pink, active female infant in arms. Placenta del spont. sm 1degree lac. No repair needed. Maternal Medical History:  Reason for admission: precip delivery in car on way to hospital. Arrived with pink, active new born female in arms.  Contractions: Onset was 6-12 hours ago.    Prenatal complications: no prenatal complications   OB History    Gravida Para Term Preterm AB TAB SAB Ectopic Multiple Living   2         1     No past medical history on file. Past Surgical History  Procedure Laterality Date  . Ear tube removal     Family History: family history includes Diabetes in her paternal grandmother; Hyperlipidemia in her maternal grandmother; Hypertension in her maternal grandmother and mother. Social History:  reports that she quit smoking about 8 months ago. She does not have any smokeless tobacco history on file. She reports that she does not drink alcohol or use illicit drugs.   Prenatal Transfer Tool  Maternal Diabetes: No Genetic Screening: Normal Maternal Ultrasounds/Referrals: Normal Fetal Ultrasounds or other Referrals:  None Maternal Substance Abuse:  No Significant Maternal Medications:  None Significant Maternal Lab Results:  None Other Comments:  None  Review of Systems  Constitutional: Negative.   HENT: Negative.   Eyes: Negative.   Respiratory: Negative.   Cardiovascular: Negative.   Gastrointestinal: Negative.   Genitourinary: Negative.   Musculoskeletal: Negative.   Skin: Negative.   Neurological: Negative.   Endo/Heme/Allergies: Negative.   Psychiatric/Behavioral: Negative.       Blood pressure 105/64, pulse 58, temperature 98.3 F (36.8 C), temperature source Oral, resp. rate 20, height 5\' 3"  (1.6 m), weight 155 lb (70.308 kg), last menstrual period 08/12/2013. Maternal Exam:  Abdomen: Patient reports no abdominal tenderness. Fundus firm, lochia sm-mod  amt  Introitus: Normal vulva. Normal vagina.    Physical Exam  Constitutional: She is oriented to person, place, and time. She appears well-developed and well-nourished.  HENT:  Head: Normocephalic.  Eyes: Pupils are equal, round, and reactive to light.  Neck: Normal range of motion.  Cardiovascular: Normal rate, regular rhythm, normal heart sounds and intact distal pulses.   Respiratory: Effort normal and breath sounds normal.  GI: Soft. Bowel sounds are normal.  Genitourinary: Vagina normal and uterus normal.  Musculoskeletal: Normal range of motion.  Neurological: She is alert and oriented to person, place, and time. She has normal reflexes.  Skin: Skin is warm and dry.  Psychiatric: She has a normal mood and affect. Her behavior is normal. Judgment and thought content normal.    Prenatal labs: ABO, Rh: O/NEG/-- (09/16 0948) Antibody: NEG (02/03 1006) Rubella: 2.04 (09/16 0948) RPR: NON REAC (02/03 1006)  HBsAg: NEGATIVE (09/16 0948)  HIV: NONREACTIVE (02/03 1006)  GBS:     Assessment/Plan: Stable preciptous del of viable fwmale at 35.[redacted] wks gestation.   Wyvonnia DuskyLAWSON, Karlita Lichtman DARLENE 04/29/2014, 8:31 AM

## 2014-04-29 NOTE — Progress Notes (Signed)
Lab in to draw blood.

## 2014-04-29 NOTE — Progress Notes (Signed)
Pt is a G2P2. Delivered her baby in the car herself. Nursery here to assess infant.

## 2014-04-29 NOTE — Progress Notes (Signed)
Camelia PhenesNatalie Frasier CNM here to assess pt for lacerations. No lacerations noted. Father of the baby at the bedside.

## 2014-04-30 ENCOUNTER — Encounter (HOSPITAL_COMMUNITY): Payer: Self-pay

## 2014-04-30 LAB — HIV ANTIBODY (ROUTINE TESTING W REFLEX): HIV Screen 4th Generation wRfx: NONREACTIVE

## 2014-04-30 LAB — RPR: RPR: NONREACTIVE

## 2014-04-30 MED ORDER — RHO D IMMUNE GLOBULIN 1500 UNIT/2ML IJ SOSY
300.0000 ug | PREFILLED_SYRINGE | Freq: Once | INTRAMUSCULAR | Status: AC
  Administered 2014-04-30: 300 ug via INTRAMUSCULAR
  Filled 2014-04-30: qty 2

## 2014-04-30 NOTE — Progress Notes (Signed)
Post Partum Day 1 Subjective: no complaints, up ad lib, voiding and tolerating PO  Objective: Blood pressure 110/64, pulse 63, temperature 98.2 F (36.8 C), temperature source Oral, resp. rate 18, height 5\' 3"  (1.6 m), weight 155 lb (70.308 kg), last menstrual period 08/12/2013, unknown if currently breastfeeding.  Physical Exam:  General: alert, cooperative, appears stated age and no distress Lochia: appropriate Uterine Fundus: firm Incision: n/a DVT Evaluation: No evidence of DVT seen on physical exam. Negative Homan's sign. No cords or calf tenderness. No significant calf/ankle edema.   Recent Labs  04/29/14 0925  HGB 12.0  HCT 34.2*    Assessment/Plan: Plan for discharge tomorrow   LOS: 1 day   Laurie Salinas DARLENE 04/30/2014, 7:17 AM

## 2014-04-30 NOTE — Lactation Note (Signed)
This note was copied from the chart of Laurie Salinas. Lactation Consultation Note Mom BF her 1st child for 1 month who is now 3 yrs. Old. Stated she had difficulty w/latching. This baby 35 6/7 weeks. Delivered in car. Appears to be ok. Noted this baby has poor suck coordination. Chomps at times, then suckles, slightly weak. Doesn't maintain tight seal. Has upper lip labial frenulum. Tongue appears to have limitation. Doesn't use the tongue to transfer.  Mom doesn't want to give bottle if she doesn't have to. Cup given for supplementing with after BF, hand express colostrum then subtract formula. Gave sheet and measuring cups.  Mom Rt. Breast slightly hypoplasia w/wide gap between breast. Hand expression w/colostrum noted. Denies PCOS. Encouraged to post-pump to stimulate the breast. Mom shown how to use DEBP & how to disassemble, clean, & reassemble parts. Mom knows to pump q3h for 15-20 min. Mom encouraged to do skin-to-skin.  Educated about LPI newborn behavior. Mom encouraged to waken baby for feeds. Mom encouraged to feed baby 8-12 times/24 hours and with feeding cues. Encouraged to call for assistance if needed and to verify proper latch. Hand expression taught to Mom. Referred to Baby and Me Book in Breastfeeding section Pg. 22-23 for position options and Proper latch demonstration. Encouraged comfort during BF so colostrum flows better and mom will enjoy the feeding longer. Taking deep breaths and breast massage during BF. WH/LC brochure given w/resources, support groups and LC services. Encouraged I&O. Baby BF well.   Patient Name: Laurie Shon HaleBrittany Tsutsui Today's Date: 04/30/2014 Reason for consult: Initial assessment   Maternal Data Has patient been taught Hand Expression?: Yes Does the patient have breastfeeding experience prior to this delivery?: Yes  Feeding Feeding Type: Formula Length of feed: 30 min  LATCH Score/Interventions Latch: Grasps breast easily, tongue down, lips  flanged, rhythmical sucking. Intervention(s): Skin to skin;Teach feeding cues;Waking techniques  Audible Swallowing: A few with stimulation Intervention(s): Skin to skin;Hand expression Intervention(s): Alternate breast massage;Hand expression  Type of Nipple: Everted at rest and after stimulation  Comfort (Breast/Nipple): Soft / non-tender     Hold (Positioning): Assistance needed to correctly position infant at breast and maintain latch. Intervention(s): Breastfeeding basics reviewed;Support Pillows;Position options;Skin to skin  LATCH Score: 8  Lactation Tools Discussed/Used Tools: Feeding cup;Pump Breast pump type: Double-Electric Breast Pump Pump Review: Setup, frequency, and cleaning;Milk Storage Initiated by:: Peri JeffersonL. Rajean Desantiago RN Date initiated:: 04/30/14   Consult Status Consult Status: Follow-up Date: 04/30/14 (in pm) Follow-up type: In-patient    Charyl DancerCARVER, [name] 04/30/2014, 4:56 AM

## 2014-04-30 NOTE — Lactation Note (Signed)
This note was copied from the chart of Girl GuernseyBrittany Klingensmith. Lactation Consultation Note  Patient Name: Girl Shon HaleBrittany Melito ZOXWR'UToday's Date: 04/30/2014 Reason for consult: Follow-up assessment Visited with Mom, baby 7832 hrs old.  Mom has been exclusively breast feeding and offering cup supplementation using expressed breast milk+/or formula.  Observed baby latching.  Mom has large in length and diameter nipples, and baby tends to latch onto nipple.  Mom interested in trying an SNS at the breast.  With assistance, baby became more nutritive.  Baby still not deep enough on breast, but suck pattern improved some.  Mom to use this as a tool.  Mom explained how to use and care for SNS.  Encouraged Mom to pump 15 minutes after breast feeding as much as she can.  Mom very tired.  To call for help prn.  Follow up in am.      Consult Status Consult Status: Follow-up Date: 05/01/14 Follow-up type: In-patient    Judee ClaraSmith, Carlen Fils E 04/30/2014, 4:30 PM

## 2014-05-01 ENCOUNTER — Encounter (HOSPITAL_COMMUNITY): Payer: Self-pay | Admitting: Obstetrics & Gynecology

## 2014-05-01 LAB — RH IG WORKUP (INCLUDES ABO/RH)
ABO/RH(D): O NEG
FETAL SCREEN: NEGATIVE
Gestational Age(Wks): 36
UNIT DIVISION: 0

## 2014-05-01 MED ORDER — IBUPROFEN 600 MG PO TABS: 600.0000 mg | ORAL_TABLET | Freq: Four times a day (QID) | ORAL | Status: DC

## 2014-05-01 MED ORDER — ACETAMINOPHEN 325 MG PO TABS: 650.0000 mg | ORAL_TABLET | ORAL | Status: DC | PRN

## 2014-05-01 NOTE — Lactation Note (Signed)
This note was copied from the chart of Girl GuernseyBrittany Kyllo. Lactation Consultation Note  Patient Name: Girl Shon HaleBrittany [place] ZOXWR'UToday's Date: 05/01/2014 Reason for consult: Follow-up assessment;Late preterm infant;Infant < 6lbs Parents demonstrated set up of SNS and latching baby independently at the breast. Mom reports if baby does not finish supplement at the breast after 30 minutes then she will cup feed the remaining supplement. Mom feels her breasts are starting to fill. She had pumped 3 ml this morning. She has a DEBP for home use. Mom does not want to use bottle to supplement at this time. Reviewed supplemental guidelines per LPT policy/handout. Encouraged to supplement with 20 ml of EBM/formula today increasing per guidelines. Reviewed with Mom limiting time at breast to minimize calorie usage with feedings. Gave parents double SNS and demonstrated how to set up/clean for use. Advised baby should be at the breast 8-12 times in 24 hours and with feeding ques. Continue to supplement using SNS unless feedings are taking more than 30-45 minutes total. Post pump to prevent engorgement and protect milk supply. Encouraged to pre-pump for 3-5 minutes once her milk is in so baby will get the higher fat milk when at the breast. Engorgement care reviewed if needed. Care for sore nipples reviewed, comfort gels given with instructions. OP f/u scheduled for Monday, 05/07/14 at 1:00.   Maternal Data    Feeding Feeding Type: Formula  LATCH Score/Interventions Latch: Grasps breast easily, tongue down, lips flanged, rhythmical sucking. Intervention(s): Breast massage  Audible Swallowing: A few with stimulation  Type of Nipple: Everted at rest and after stimulation  Comfort (Breast/Nipple): Filling, red/small blisters or bruises, mild/mod discomfort  Problem noted: Mild/Moderate discomfort Interventions (Mild/moderate discomfort): Hand massage;Hand expression  Hold (Positioning): Assistance needed to  correctly position infant at breast and maintain latch. Intervention(s): Breastfeeding basics reviewed  LATCH Score: 7  Lactation Tools Discussed/Used Tools: Pump;Feeding cup;Comfort gels;Supplemental Nutrition System Breast pump type: Double-Electric Breast Pump   Consult Status Consult Status: Complete Date: 05/01/14 Follow-up type: In-patient    Alfred LevinsGranger, Arless Vineyard Ann 05/01/2014, 9:01 AM

## 2014-05-01 NOTE — Discharge Summary (Signed)
Obstetric Discharge Summary Reason for Admission: Precipitous delivery in car Prenatal Procedures: none Intrapartum Procedures: none Postpartum Procedures: Rho(D) Ig Complications-Operative and Postpartum: 1st degree perineal laceration HEMOGLOBIN  Date Value Ref Range Status  04/29/2014 12.0 12.0 - 15.0 g/dL Final   HCT  Date Value Ref Range Status  04/29/2014 34.2* 36.0 - 46.0 % Final    Laurie Salinas is a 29 y.o. female who presented for birth  in car on way to hospital.  Arrived with pink, active female infant in arms sp delivery approx 0800 on 04/29/14. Placenta was delivered spontaneously.  1st degree laceration; no repair needed. She received rhophylac 3/21  Physical Exam:  General: alert, cooperative and icteric Lochia: appropriate Uterine Fundus: firm Incision: N/A DVT Evaluation: No evidence of DVT seen on physical exam.  Discharge Diagnoses: Preterm, delivered  Discharge Information: Date: 05/01/2014 Activity: pelvic rest Diet: routine Medications: Ibuprofen Condition: stable Instructions: refer to practice specific booklet Discharge to: home Follow-up Information    Follow up with [facility]. Schedule an appointment as soon as possible for a visit in 4 weeks.   Contact information:   [address] [place] [id number]       Newborn Data: Live born female  Birth Weight: 5 lb 3.8 oz (2375 g) APGAR: 9, 9  Home with mother. Feeding: breast Contraception: Mirena  Laurie Salinas 05/01/2014, 7:36 AM

## 2014-05-03 ENCOUNTER — Encounter: Payer: 59 | Admitting: Obstetrics & Gynecology

## 2014-05-07 ENCOUNTER — Ambulatory Visit (HOSPITAL_COMMUNITY): Admit: 2014-05-07 | Payer: 59

## 2014-05-10 ENCOUNTER — Encounter: Payer: 59 | Admitting: Obstetrics & Gynecology

## 2014-05-17 ENCOUNTER — Encounter: Payer: 59 | Admitting: Obstetrics & Gynecology

## 2014-05-24 ENCOUNTER — Encounter: Payer: 59 | Admitting: Obstetrics & Gynecology

## 2014-05-31 ENCOUNTER — Encounter: Payer: Self-pay | Admitting: Obstetrics & Gynecology

## 2014-05-31 ENCOUNTER — Ambulatory Visit (INDEPENDENT_AMBULATORY_CARE_PROVIDER_SITE_OTHER): Payer: 59 | Admitting: Obstetrics & Gynecology

## 2014-05-31 ENCOUNTER — Ambulatory Visit: Payer: 59 | Admitting: Obstetrics & Gynecology

## 2014-05-31 VITALS — BP 101/68 | HR 52 | Resp 16 | Ht 63.0 in | Wt 143.0 lb

## 2014-05-31 DIAGNOSIS — Z124 Encounter for screening for malignant neoplasm of cervix: Secondary | ICD-10-CM | POA: Diagnosis not present

## 2014-05-31 DIAGNOSIS — Z30430 Encounter for insertion of intrauterine contraceptive device: Secondary | ICD-10-CM

## 2014-05-31 DIAGNOSIS — Z0000 Encounter for general adult medical examination without abnormal findings: Secondary | ICD-10-CM

## 2014-05-31 MED ORDER — LEVONORGESTREL 20 MCG/24HR IU IUD
INTRAUTERINE_SYSTEM | Freq: Once | INTRAUTERINE | Status: AC
  Administered 2014-05-31: 16:00:00 via INTRAUTERINE

## 2014-05-31 NOTE — Progress Notes (Signed)
  Subjective:     Laurie Salinas is a 29 y.o. MW female who presents for a postpartum visit. She is 4 weeks postpartum following a spontaneous vaginal delivery. I have fully reviewed the prenatal and intrapartum course. The delivery was at 36 gestational weeks. Outcome: spontaneous vaginal delivery (precipitous in the passenger seat of the car while her husband drove to the hospital). Anesthesia: none. Postpartum course has been normal. Baby's course has been normal. Baby is feeding by both breast and bottle - Carn[employer] Good Start. Bleeding staining only. Bowel function is normal. Bladder function is normal. Patient is not sexually active. Contraception method is IUD. Postpartum depression screening: negative.  The following portions of the patient's history were reviewed and updated as appropriate: allergies, current medications, past family history, past medical history, past social history, past surgical history and problem list.  Review of Systems Pertinent items are noted in HPI.   Objective:    BP 101/68 mmHg  Pulse 52  Resp 16  Ht 5\' 3"  (1.6 m)  Wt 143 lb (64.864 kg)  BMI 25.34 kg/m2  Breastfeeding? Yes  General:  alert   Breasts:  inspection negative, no nipple discharge or bleeding, no masses or nodularity palpable  Lungs: clear to auscultation bilaterally  Heart:  regular rate and rhythm, S1, S2 normal, no murmur, click, rub or gallop  Abdomen: soft, non-tender; bowel sounds normal; no masses,  no organomegaly   Vulva:  normal  Vagina: normal vagina  Cervix:  anteverted  Corpus: normal  Adnexa:  normal adnexa  Rectal Exam: Not performed.         UPT negative, consent signed, Time out procedure done. Cervix prepped with betadine and grasped with a single tooth tenaculum. Mirena was easily placed and the strings were cut to 3-4 cm. Uterus sounded to 9 cm. She tolerated the procedure well.   Assessment:    Normalpostpartum exam. Pap smear done at today's visit.    Plan:    1. Contraception: IUD RTC 4 weeks for a string check/prn sooner

## 2014-06-04 LAB — CYTOLOGY - PAP

## 2014-06-28 ENCOUNTER — Ambulatory Visit: Payer: 59 | Admitting: Obstetrics & Gynecology

## 2014-06-28 DIAGNOSIS — Z30431 Encounter for routine checking of intrauterine contraceptive device: Secondary | ICD-10-CM

## 2015-06-11 ENCOUNTER — Encounter: Payer: Self-pay | Admitting: Obstetrics & Gynecology

## 2015-06-11 ENCOUNTER — Ambulatory Visit (INDEPENDENT_AMBULATORY_CARE_PROVIDER_SITE_OTHER): Payer: Medicaid Other | Admitting: Obstetrics & Gynecology

## 2015-06-11 ENCOUNTER — Ambulatory Visit (INDEPENDENT_AMBULATORY_CARE_PROVIDER_SITE_OTHER): Payer: Medicaid Other

## 2015-06-11 ENCOUNTER — Other Ambulatory Visit (HOSPITAL_COMMUNITY)
Admission: RE | Admit: 2015-06-11 | Discharge: 2015-06-11 | Disposition: A | Discharge status: Home / Self Care | Payer: Medicaid Other | Source: Ambulatory Visit | Attending: Obstetrics & Gynecology | Admitting: Obstetrics & Gynecology

## 2015-06-11 VITALS — BP 107/74 | HR 67 | Ht 63.0 in | Wt 129.0 lb

## 2015-06-11 DIAGNOSIS — Z113 Encounter for screening for infections with a predominantly sexual mode of transmission: Secondary | ICD-10-CM | POA: Insufficient documentation

## 2015-06-11 DIAGNOSIS — T8332XA Displacement of intrauterine contraceptive device, initial encounter: Secondary | ICD-10-CM | POA: Diagnosis not present

## 2015-06-11 DIAGNOSIS — Z30431 Encounter for routine checking of intrauterine contraceptive device: Secondary | ICD-10-CM | POA: Diagnosis not present

## 2015-06-11 DIAGNOSIS — Z7253 High risk bisexual behavior: Secondary | ICD-10-CM

## 2015-06-11 DIAGNOSIS — T8389XA Other specified complication of genitourinary prosthetic devices, implants and grafts, initial encounter: Secondary | ICD-10-CM

## 2015-06-11 MED ORDER — MISOPROSTOL 200 MCG PO TABS: ORAL_TABLET | ORAL | Status: DC

## 2015-06-11 NOTE — Progress Notes (Signed)
   Subjective:    Patient ID: Laurie Salinas, female    DOB: [birth year], 30 y.o.   MRN: [id number]  HPI  30 year old female presents for IUD evaluation. Patient never came for string check. She does have a monthly menses that is not too heavy and not bothersome she just wonders why she still has a period each month. She denies pelvic pain.  Review of Systems  Constitutional: Negative.   Gastrointestinal: Negative.   Genitourinary: Positive for vaginal bleeding. Negative for vaginal discharge, vaginal pain, menstrual problem and pelvic pain.       Objective:   Physical Exam  Constitutional: She appears well-developed and well-nourished. No distress.  HENT:  Head: Normocephalic and atraumatic.  Pulmonary/Chest: Effort normal.  Abdominal: Soft. She exhibits no distension and no mass. There is no tenderness. There is no rebound and no guarding.  Genitourinary: Vagina normal and uterus normal.  Pt menstruating Cervix no lesions No iud strings seen  Skin: Skin is warm and dry.  Psychiatric: She has a normal mood and affect.   Tried to grasp strings in lower cervix without success.      Assessment & Plan:  30 year old female with no IUD string seen. Bedside ultrasound questionably shows IUD in the lower uterine segment/endocervical canal. Difficult to tell with our ultrasound so we will send for official ultrasound   Addendum:  Measurements: 9.0 x 3.8 x 4.5 cm. No fibroids or other mass visualized.  Endometrium  Thickness: 2.2 mm. The upper endometrial canal is normal in appearance. The lower endometrial canal is remarkable for echogenic appearance, associated with posterior acoustic shadowing, likely representing the intrauterine device, lower than expected. No evidence that a portion of the IUD is in the myometrium.  Right ovary  Measurements: 3.2 x 2.1 x 3.1 cm. Normal appearance/no adnexal mass.  Left ovary  Measurements: 3.7 x 2.4 x 3.2 cm. Normal  appearance/no adnexal mass.  Other findings: No abnormal free fluid  IMPRESSION: 1. Suspect that the intrauterine device is within the lower uterine segment/endocervical canal. 2. Normal appearance of the ovaries.   Up to date: As a general rule, if the IUD is located in the lower uterine segment or near (but not at) the fundus, we tend to leave it in place as most IUDs will not be expelled, and some migrate to a more fundal position [25-27]. If an IUD is below the internal cervical os, we recommend removal because expulsion may follow, and may not be detected. Ideally, a new IUD will be placed or another highly effective contraceptive method will be initiated at the same visit in order to avoid an interruption in contraception.   We'll call patient with results to see if she wants to have the IUD removed removed to avoid unwanted expulsion.

## 2015-06-11 NOTE — Progress Notes (Signed)
Here today for routine IUD check, she never had it following insertion.  No concerns other that she does still have a monthly period.

## 2015-06-12 ENCOUNTER — Telehealth: Payer: Self-pay | Admitting: *Deleted

## 2015-06-12 LAB — HEPATITIS B SURFACE ANTIGEN: Hepatitis B Surface Ag: NEGATIVE

## 2015-06-12 LAB — HIV ANTIBODY (ROUTINE TESTING W REFLEX): HIV: NONREACTIVE

## 2015-06-12 LAB — HEPATITIS C ANTIBODY: HCV AB: NEGATIVE

## 2015-06-12 LAB — RPR

## 2015-06-12 NOTE — Telephone Encounter (Signed)
Pt notified of normal lab work. 

## 2015-06-13 LAB — CERVICOVAGINAL ANCILLARY ONLY
Chlamydia: NEGATIVE
Neisseria Gonorrhea: NEGATIVE

## 2015-06-24 ENCOUNTER — Ambulatory Visit (INDEPENDENT_AMBULATORY_CARE_PROVIDER_SITE_OTHER): Payer: Medicaid Other | Admitting: Obstetrics & Gynecology

## 2015-06-24 ENCOUNTER — Encounter: Payer: Self-pay | Admitting: Obstetrics & Gynecology

## 2015-06-24 VITALS — BP 99/66 | HR 73 | Ht 63.0 in | Wt 132.0 lb

## 2015-06-24 DIAGNOSIS — T8332XA Displacement of intrauterine contraceptive device, initial encounter: Secondary | ICD-10-CM | POA: Diagnosis not present

## 2015-06-24 DIAGNOSIS — Z30433 Encounter for removal and reinsertion of intrauterine contraceptive device: Secondary | ICD-10-CM | POA: Diagnosis not present

## 2015-06-24 NOTE — Progress Notes (Signed)
   Subjective:    Patient ID: Laurie HaleBrittany Salinas, female    DOB: March 21, 1985, 30 y.o.   MRN: [id number]  HPI 30 yo P2 here to have her 30 yo Mirena IUD removed. She had an u/s done that showed it to be in the lower uterine segment. She would like to restart OCPs.   Review of Systems     Objective:   Physical Exam [id number] Breathing, conversing, and ambulating normally The strings were not visible. I could not find the IUD with the uterine dressing forceps and the IUD hook. She tolerated the procedure well.       Assessment & Plan:  Malpositioned/retained IUD- plan for removal in the OR

## 2015-06-26 ENCOUNTER — Encounter (HOSPITAL_COMMUNITY): Payer: Self-pay | Admitting: *Deleted

## 2015-06-26 ENCOUNTER — Telehealth: Payer: Self-pay

## 2015-06-26 MED ORDER — NORGESTREL-ETHINYL ESTRADIOL 0.3-30 MG-MCG PO TABS: 1.0000 | ORAL_TABLET | Freq: Every day | ORAL | Status: AC

## 2015-06-26 NOTE — Telephone Encounter (Signed)
Patient called stating that no birth control was called in for her after her visit.  Called Dr. Marice Potterove and she would like Lo-ovral called in for patient. RX sent   Called patient back and made her aware of the prescription and that I had sent it in. Patient states understanding. Laurie StammerJennifer Douglass Dunshee RN BSN

## 2015-07-26 ENCOUNTER — Encounter (HOSPITAL_COMMUNITY): Payer: Self-pay | Admitting: *Deleted

## 2015-07-26 ENCOUNTER — Ambulatory Visit (HOSPITAL_COMMUNITY): Payer: Medicaid Other | Admitting: Certified Registered Nurse Anesthetist

## 2015-07-26 ENCOUNTER — Ambulatory Visit (HOSPITAL_COMMUNITY)
Admission: RE | Admit: 2015-07-26 | Discharge: 2015-07-26 | Disposition: A | Discharge status: Home / Self Care | Payer: Medicaid Other | Source: Ambulatory Visit | Attending: Obstetrics & Gynecology | Admitting: Obstetrics & Gynecology

## 2015-07-26 ENCOUNTER — Encounter (HOSPITAL_COMMUNITY)
Admission: RE | Disposition: A | Discharge status: Home / Self Care | Payer: Self-pay | Source: Ambulatory Visit | Attending: Obstetrics & Gynecology

## 2015-07-26 DIAGNOSIS — F329 Major depressive disorder, single episode, unspecified: Secondary | ICD-10-CM | POA: Diagnosis not present

## 2015-07-26 DIAGNOSIS — Z881 Allergy status to other antibiotic agents status: Secondary | ICD-10-CM | POA: Insufficient documentation

## 2015-07-26 DIAGNOSIS — Z30432 Encounter for removal of intrauterine contraceptive device: Secondary | ICD-10-CM

## 2015-07-26 DIAGNOSIS — F419 Anxiety disorder, unspecified: Secondary | ICD-10-CM | POA: Insufficient documentation

## 2015-07-26 DIAGNOSIS — F17200 Nicotine dependence, unspecified, uncomplicated: Secondary | ICD-10-CM | POA: Insufficient documentation

## 2015-07-26 DIAGNOSIS — Z880 Allergy status to penicillin: Secondary | ICD-10-CM | POA: Diagnosis not present

## 2015-07-26 DIAGNOSIS — Z79899 Other long term (current) drug therapy: Secondary | ICD-10-CM | POA: Insufficient documentation

## 2015-07-26 DIAGNOSIS — T8332XD Displacement of intrauterine contraceptive device, subsequent encounter: Secondary | ICD-10-CM | POA: Diagnosis not present

## 2015-07-26 HISTORY — DX: Calculus of gallbladder without cholecystitis without obstruction: K80.20

## 2015-07-26 HISTORY — DX: Anxiety disorder, unspecified: F41.9

## 2015-07-26 HISTORY — PX: HYSTEROSCOPY: SHX211

## 2015-07-26 HISTORY — DX: Depression, unspecified: F32.A

## 2015-07-26 HISTORY — DX: Major depressive disorder, single episode, unspecified: F32.9

## 2015-07-26 LAB — CBC
HCT: 40.1 % (ref 36.0–46.0)
Hemoglobin: 14 g/dL (ref 12.0–15.0)
MCH: 32 pg (ref 26.0–34.0)
MCHC: 34.9 g/dL (ref 30.0–36.0)
MCV: 91.8 fL (ref 78.0–100.0)
PLATELETS: 168 10*3/uL (ref 150–400)
RBC: 4.37 MIL/uL (ref 3.87–5.11)
RDW: 12.7 % (ref 11.5–15.5)
WBC: 7.6 10*3/uL (ref 4.0–10.5)

## 2015-07-26 LAB — PREGNANCY, URINE: PREG TEST UR: NEGATIVE

## 2015-07-26 SURGERY — HYSTEROSCOPY
Anesthesia: Monitor Anesthesia Care | Site: Vagina

## 2015-07-26 MED ORDER — LACTATED RINGERS IV SOLN
INTRAVENOUS | Status: DC
  Administered 2015-07-26: 13:00:00 via INTRAVENOUS

## 2015-07-26 MED ORDER — FENTANYL CITRATE (PF) 100 MCG/2ML IJ SOLN: 25.0000 ug | INTRAMUSCULAR | Status: DC | PRN

## 2015-07-26 MED ORDER — OXYCODONE-ACETAMINOPHEN 5-325 MG PO TABS: 1.0000 | ORAL_TABLET | Freq: Four times a day (QID) | ORAL | Status: AC | PRN

## 2015-07-26 MED ORDER — KETOROLAC TROMETHAMINE 30 MG/ML IJ SOLN
INTRAMUSCULAR | Status: AC
  Filled 2015-07-26: qty 1

## 2015-07-26 MED ORDER — SCOPOLAMINE 1 MG/3DAYS TD PT72
1.0000 | MEDICATED_PATCH | Freq: Once | TRANSDERMAL | Status: DC
  Administered 2015-07-26: 1.5 mg via TRANSDERMAL

## 2015-07-26 MED ORDER — FENTANYL CITRATE (PF) 100 MCG/2ML IJ SOLN
INTRAMUSCULAR | Status: AC
  Filled 2015-07-26: qty 2

## 2015-07-26 MED ORDER — IBUPROFEN 600 MG PO TABS: 600.0000 mg | ORAL_TABLET | Freq: Four times a day (QID) | ORAL | Status: AC | PRN

## 2015-07-26 MED ORDER — PROPOFOL 10 MG/ML IV BOLUS
INTRAVENOUS | Status: AC
  Filled 2015-07-26: qty 20

## 2015-07-26 MED ORDER — ONDANSETRON HCL 4 MG/2ML IJ SOLN
INTRAMUSCULAR | Status: DC | PRN
  Administered 2015-07-26: 4 mg via INTRAVENOUS

## 2015-07-26 MED ORDER — KETOROLAC TROMETHAMINE 30 MG/ML IJ SOLN
INTRAMUSCULAR | Status: DC | PRN
Start: 2015-07-26 — End: 2015-07-26
  Administered 2015-07-26: 30 mg via INTRAVENOUS

## 2015-07-26 MED ORDER — PROMETHAZINE HCL 25 MG/ML IJ SOLN: 6.2500 mg | INTRAMUSCULAR | Status: DC | PRN

## 2015-07-26 MED ORDER — LIDOCAINE HCL (CARDIAC) 20 MG/ML IV SOLN
INTRAVENOUS | Status: DC | PRN
  Administered 2015-07-26: 60 mg via INTRAVENOUS

## 2015-07-26 MED ORDER — BUPIVACAINE HCL (PF) 0.5 % IJ SOLN
INTRAMUSCULAR | Status: AC
  Filled 2015-07-26: qty 30

## 2015-07-26 MED ORDER — PROPOFOL 10 MG/ML IV BOLUS
INTRAVENOUS | Status: DC | PRN
  Administered 2015-07-26: 20 mg via INTRAVENOUS
  Administered 2015-07-26 (×2): 10 mg via INTRAVENOUS
  Administered 2015-07-26 (×2): 20 mg via INTRAVENOUS
  Administered 2015-07-26: 40 mg via INTRAVENOUS

## 2015-07-26 MED ORDER — DEXAMETHASONE SODIUM PHOSPHATE 10 MG/ML IJ SOLN
INTRAMUSCULAR | Status: DC | PRN
Start: 2015-07-26 — End: 2015-07-26
  Administered 2015-07-26: 4 mg via INTRAVENOUS

## 2015-07-26 MED ORDER — ONDANSETRON HCL 4 MG/2ML IJ SOLN
INTRAMUSCULAR | Status: AC
  Filled 2015-07-26: qty 2

## 2015-07-26 MED ORDER — SCOPOLAMINE 1 MG/3DAYS TD PT72
MEDICATED_PATCH | TRANSDERMAL | Status: AC
  Filled 2015-07-26: qty 1

## 2015-07-26 MED ORDER — DEXAMETHASONE SODIUM PHOSPHATE 4 MG/ML IJ SOLN
INTRAMUSCULAR | Status: AC
  Filled 2015-07-26: qty 1

## 2015-07-26 MED ORDER — MIDAZOLAM HCL 2 MG/2ML IJ SOLN
INTRAMUSCULAR | Status: DC | PRN
  Administered 2015-07-26: 2 mg via INTRAVENOUS

## 2015-07-26 MED ORDER — LIDOCAINE HCL (CARDIAC) 20 MG/ML IV SOLN
INTRAVENOUS | Status: AC
  Filled 2015-07-26: qty 5

## 2015-07-26 MED ORDER — BUPIVACAINE HCL (PF) 0.5 % IJ SOLN
INTRAMUSCULAR | Status: DC | PRN
  Administered 2015-07-26: 10 mL

## 2015-07-26 MED ORDER — FENTANYL CITRATE (PF) 100 MCG/2ML IJ SOLN
INTRAMUSCULAR | Status: DC | PRN
  Administered 2015-07-26: 100 ug via INTRAVENOUS

## 2015-07-26 MED ORDER — MIDAZOLAM HCL 2 MG/2ML IJ SOLN
INTRAMUSCULAR | Status: AC
  Filled 2015-07-26: qty 2

## 2015-07-26 SURGICAL SUPPLY — 19 items
BIPOLAR CUTTING LOOP 21FR (ELECTRODE)
CANISTER SUCT 3000ML (MISCELLANEOUS) ×2 IMPLANT
CATH ROBINSON RED A/P 16FR (CATHETERS) ×2 IMPLANT
CLOTH BEACON ORANGE TIMEOUT ST (SAFETY) ×2 IMPLANT
CONTAINER PREFILL 10% NBF 60ML (FORM) ×4 IMPLANT
DILATOR CANAL MILEX (MISCELLANEOUS) IMPLANT
ELECT REM PT RETURN 9FT ADLT (ELECTROSURGICAL)
ELECTRODE REM PT RTRN 9FT ADLT (ELECTROSURGICAL) IMPLANT
GLOVE BIO SURGEON STRL SZ 6.5 (GLOVE) ×2 IMPLANT
GLOVE BIOGEL PI IND STRL 7.0 (GLOVE) ×1 IMPLANT
GLOVE BIOGEL PI INDICATOR 7.0 (GLOVE) ×1
GOWN STRL REUS W/TWL LRG LVL3 (GOWN DISPOSABLE) ×4 IMPLANT
LOOP CUTTING BIPOLAR 21FR (ELECTRODE) IMPLANT
PACK VAGINAL MINOR WOMEN LF (CUSTOM PROCEDURE TRAY) ×2 IMPLANT
PAD OB MATERNITY 4.3X12.25 (PERSONAL CARE ITEMS) ×2 IMPLANT
TOWEL OR 17X24 6PK STRL BLUE (TOWEL DISPOSABLE) ×4 IMPLANT
TUBING AQUILEX INFLOW (TUBING) ×2 IMPLANT
TUBING AQUILEX OUTFLOW (TUBING) ×2 IMPLANT
WATER STERILE IRR 1000ML POUR (IV SOLUTION) ×2 IMPLANT

## 2015-07-26 NOTE — Discharge Instructions (Signed)

## 2015-07-26 NOTE — H&P (Signed)
Laurie Salinas is an 30 y.o. female SW P2 here to have her malpositioned IUD removed. She is on OCPs for the last 3 weeks.   Patient's last menstrual period was 06/05/2015 (approximate).    Past Medical History  Diagnosis Date  . SVD (spontaneous vaginal delivery)     x 2  . Gall stones   . Depression   . Anxiety     Past Surgical History  Procedure Laterality Date  . Ear tube removal      Family History  Problem Relation Age of Onset  . Hypertension Mother   . Hypertension Maternal Grandmother   . Diabetes Paternal Grandmother   . Hyperlipidemia Maternal Grandmother     Social History:  reports that she has been smoking Cigarettes.  She has been smoking about 0.50 packs per day. She has never used smokeless tobacco. She reports that she drinks alcohol. She reports that she does not use illicit drugs.  Allergies:  Allergies  Allergen Reactions  . Cefaclor Anaphylaxis    Laurie Salinas syndrome  . Penicillins Rash    Has patient had a PCN reaction causing immediate rash, facial/tongue/throat swelling, SOB or lightheadedness with hypotension: Yes Has patient had a PCN reaction causing severe rash involving mucus membranes or skin necrosis: Yes Has patient had a PCN reaction that required hospitalization Yes Has patient had a PCN reaction occurring within the last 10 years:   If all of the above answers are "NO", then may proceed with Cephalosporin use.     Prescriptions prior to admission  Medication Sig Dispense Refill Last Dose  . citalopram (CELEXA) 20 MG tablet take 1 tablet by mouth daily  1 07/26/2015 at Unknown time  . norgestrel-ethinyl estradiol (LO/OVRAL,CRYSELLE) 0.3-30 MG-MCG tablet Take 1 tablet by mouth daily. 1 Package 11 07/26/2015 at Unknown time  . CHANTIX STARTING MONTH PAK 0.5 MG X 11 & 1 MG X 42 tablet TK UTD  0 07/19/2015  . levonorgestrel (MIRENA) 20 MCG/24HR IUD 1 each by Intrauterine route once.   Taking  . misoprostol (CYTOTEC) 200 MCG tablet  Place 3 tablets into vagina the night prior to procedure 3 tablet 0 More than a month at Unknown time    ROS  Blood pressure 118/88, pulse 65, temperature 98.6 F (37 C), temperature source Oral, resp. rate 18, height 5\' 3"  (1.6 m), weight 59.875 kg (132 lb), last menstrual period 06/05/2015, SpO2 100 %, not currently breastfeeding. Physical Exam  Heart- rrr Lungs- CTAB Abd- benign  Results for orders placed or performed during the hospital encounter of 07/26/15 (from the past 24 hour(s))  CBC     Status: None   Collection Time: 07/26/15 12:25 PM  Result Value Ref Range   WBC 7.6 4.0 - 10.5 K/uL   RBC 4.37 3.87 - 5.11 MIL/uL   Hemoglobin 14.0 12.0 - 15.0 g/dL   HCT 16.140.1 09.636.0 - 04.546.0 %   MCV 91.8 78.0 - 100.0 fL   MCH 32.0 26.0 - 34.0 pg   MCHC 34.9 30.0 - 36.0 g/dL   RDW 40.912.7 81.111.5 - 91.415.5 %   Platelets 168 150 - 400 K/uL  Pregnancy, urine     Status: None   Collection Time: 07/26/15 12:30 PM  Result Value Ref Range   Preg Test, Ur NEGATIVE NEGATIVE    No results found.  Assessment/Plan: Retained IUD- plan for removal in the OR as this was not possible in the office  Tom Ragsdale C Sharrell Krawiec 07/26/2015, 1:05 PM

## 2015-07-26 NOTE — Anesthesia Postprocedure Evaluation (Signed)
Anesthesia Post Note  Patient: Laurie Salinas  Procedure(s) Performed: Procedure(s) (LRB): HYSTEROSCOPY (N/A)  Patient location during evaluation: PACU Anesthesia Type: MAC Level of consciousness: awake and alert Pain management: pain level controlled Vital Signs Assessment: post-procedure vital signs reviewed and stable Respiratory status: spontaneous breathing, nonlabored ventilation, respiratory function stable and patient connected to nasal cannula oxygen Cardiovascular status: stable and blood pressure returned to baseline Anesthetic complications: no     Last Vitals:  Filed Vitals:   07/26/15 1445 07/26/15 1446  BP: 109/74   Pulse: 57 57  Temp:  36.9 C  Resp: 11 16    Last Pain: There were no vitals filed for this visit. Pain Goal: Patients Stated Pain Goal: 3 (07/26/15 1237)               Branae Crail J

## 2015-07-26 NOTE — Transfer of Care (Signed)
Immediate Anesthesia Transfer of Care Note  Patient: Laurie Salinas  Procedure(s) Performed: Procedure(s) with comments: HYSTEROSCOPY (N/A) - IUD removal using hysteroscopy  Patient Location: PACU  Anesthesia Type:MAC  Level of Consciousness: awake, alert , oriented and patient cooperative  Airway & Oxygen Therapy: Patient Spontanous Breathing and Patient connected to nasal cannula oxygen  Post-op Assessment: Report given to RN and Post -op Vital signs reviewed and stable  Post vital signs: Reviewed and stable  Last Vitals:  Filed Vitals:   07/26/15 1237  BP: 118/88  Pulse: 65  Temp: 37 C  Resp: 18    Last Pain: There were no vitals filed for this visit.    Patients Stated Pain Goal: 3 (07/26/15 1237)  Complications: No apparent anesthesia complications

## 2015-07-26 NOTE — Anesthesia Preprocedure Evaluation (Signed)
Anesthesia Evaluation  Patient identified by MRN, date of birth, ID band Patient awake    Reviewed: Allergy & Precautions, NPO status , Patient's Chart, lab work & pertinent test results  Airway Mallampati: II  TM Distance: >3 FB Neck ROM: Full    Dental no notable dental hx.    Pulmonary Current Smoker,    Pulmonary exam normal breath sounds clear to auscultation       Cardiovascular negative cardio ROS Normal cardiovascular exam Rhythm:Regular Rate:Normal     Neuro/Psych PSYCHIATRIC DISORDERS Anxiety Depression negative neurological ROS     GI/Hepatic negative GI ROS, Neg liver ROS,   Endo/Other  negative endocrine ROS  Renal/GU negative Renal ROS  negative genitourinary   Musculoskeletal negative musculoskeletal ROS (+)   Abdominal   Peds negative pediatric ROS (+)  Hematology negative hematology ROS (+)   Anesthesia Other Findings   Reproductive/Obstetrics negative OB ROS                             Anesthesia Physical Anesthesia Plan  ASA: II  Anesthesia Plan: MAC   Post-op Pain Management:    Induction: Intravenous  Airway Management Planned: Natural Airway  Additional Equipment:   Intra-op Plan:   Post-operative Plan:   Informed Consent: I have reviewed the patients History and Physical, chart, labs and discussed the procedure including the risks, benefits and alternatives for the proposed anesthesia with the patient or authorized representative who has indicated his/her understanding and acceptance.   Dental advisory given  Plan Discussed with: CRNA  Anesthesia Plan Comments:         Anesthesia Quick Evaluation

## 2015-07-26 NOTE — Op Note (Signed)
07/26/2015  2:07 PM  PATIENT:  Laurie HaleBrittany Salinas  30 y.o. female  PRE-OPERATIVE DIAGNOSIS:  Retained, malpositioned IUD  POST-OPERATIVE DIAGNOSIS: absence of IUD  PROCEDURE:  hysteroscopy  SURGEON:  Surgeon(s) and Role:    * Allie BossierMyra C Hanad Leino, MD - Primary  ANESTHESIA:   local and IV sedation  EBL:  Total I/O In: 900 [I.V.:900] Out: 5 [Blood:5]  BLOOD ADMINISTERED:none  DRAINS: none   LOCAL MEDICATIONS USED:  MARCAINE     SPECIMEN:  No Specimen  DISPOSITION OF SPECIMEN:  N/A  COUNTS:  YES  TOURNIQUET:  * No tourniquets in log *  DICTATION: .Dragon Dictation  PLAN OF CARE: Discharge to home after PACU  PATIENT DISPOSITION:  PACU - hemodynamically stable.   Delay start of Pharmacological VTE agent (>24hrs) due to surgical blood loss or risk of bleeding: not applicable    The risks, benefits, and alternatives of surgery were explained, understood, and accepted. All questions were answered. Consents were signed. In the operating room MAC anesthesia was applied without complication, and she was placed in the dorsal lithotomy position. Her vagina was prepped and draped in the usual sterile fashion. A bimanual exam revealed a NSSA uterus. Her adnexa were nonenlarged. A speculum was placed and a single-tooth tenaculum was used to grasp the anterior lip of her cervix. A total of 30 mL of 0.5% Marcaine was used to perform a paracervical block. Her cervix was noted to be dilated enough to accomodate a uterine dressing forceps. I used them to examine the interior of there uterus. I did not find the IUD. I used a small curette to examine the interior of the uterus and still no IUD was found. I then did a hysteroscopy and NO IUD was present.  There was no bleeding noted at the end of the case. She was taken to the recovery room after being extubated. She tolerated the procedure well.

## 2015-07-28 ENCOUNTER — Encounter (HOSPITAL_COMMUNITY): Payer: Self-pay | Admitting: Obstetrics & Gynecology

## 2015-08-29 IMAGING — US US OB COMP +14 WK
1 series · 12 of 28 positions shown · non-contrast
Comparison: none

[Series 1: us ob comp +14 wk · 0.21mm/px · 12 of 122 slices shown]
[im 5/122]
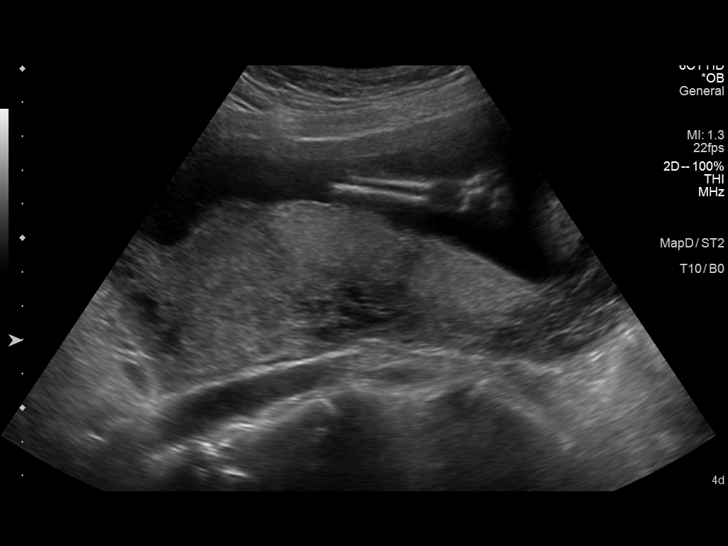
[im 14/122]
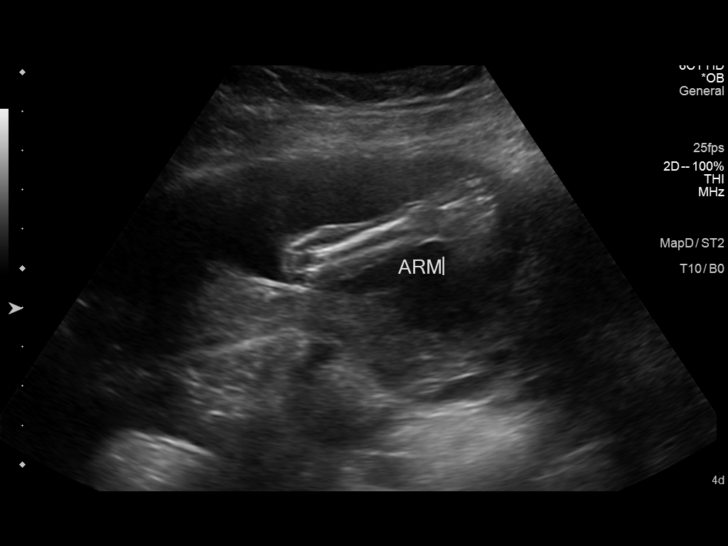
[im 23/122]
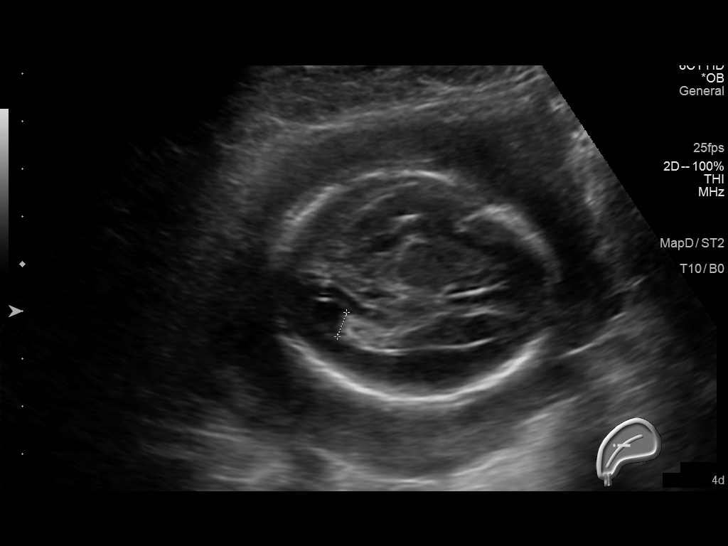
[im 36/122]
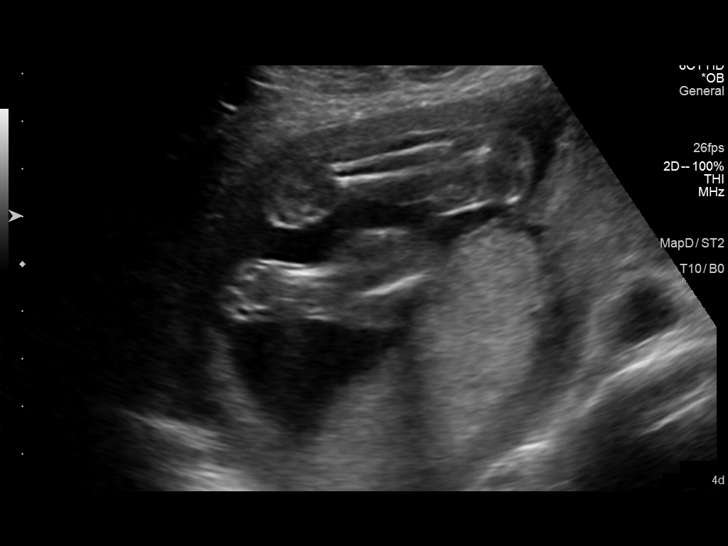
[im 45/122]
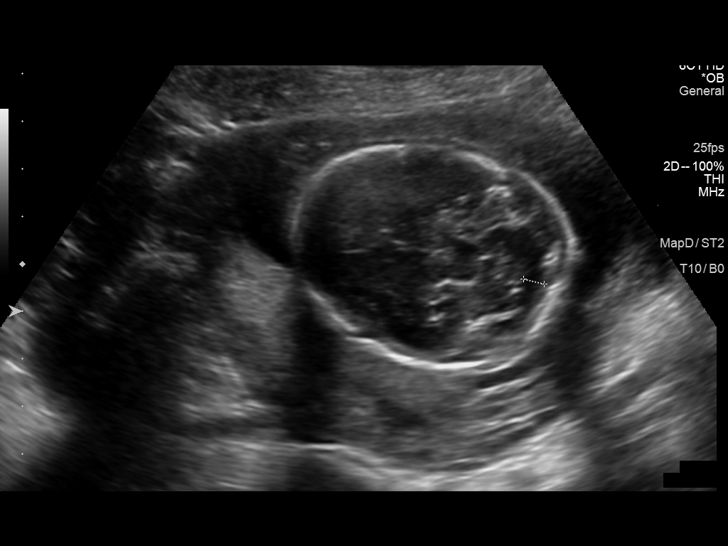
[im 54/122]
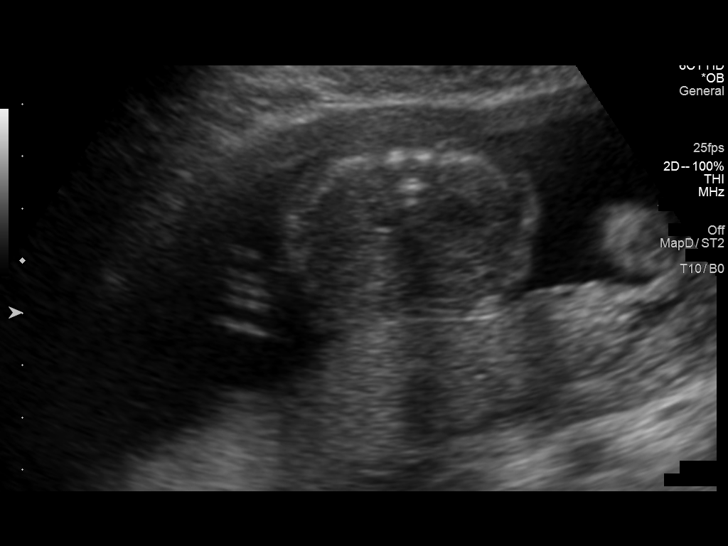
[im 68/122]
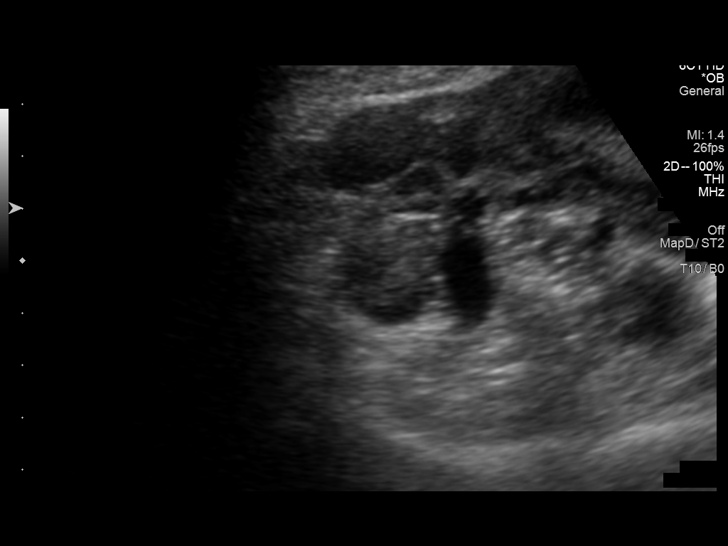
[im 77/122]
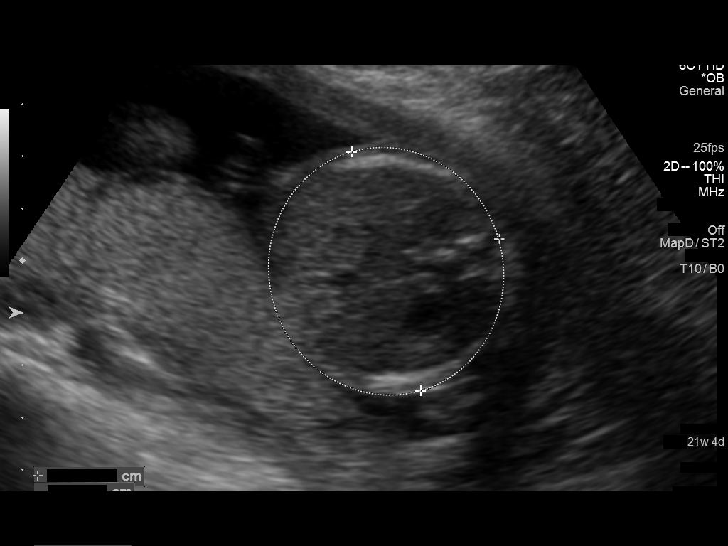
[im 86/122]
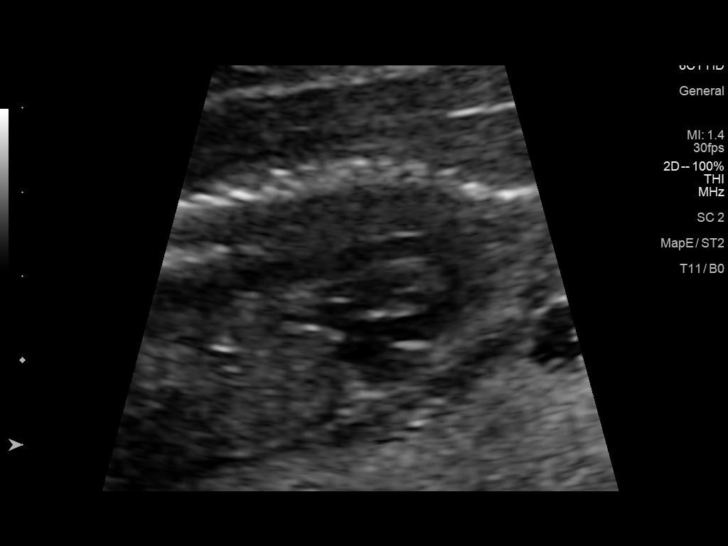
[im 99/122]
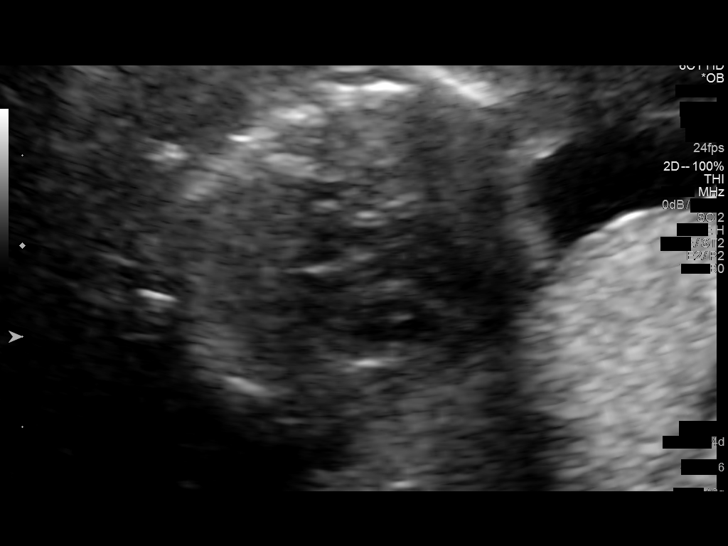
[im 108/122]
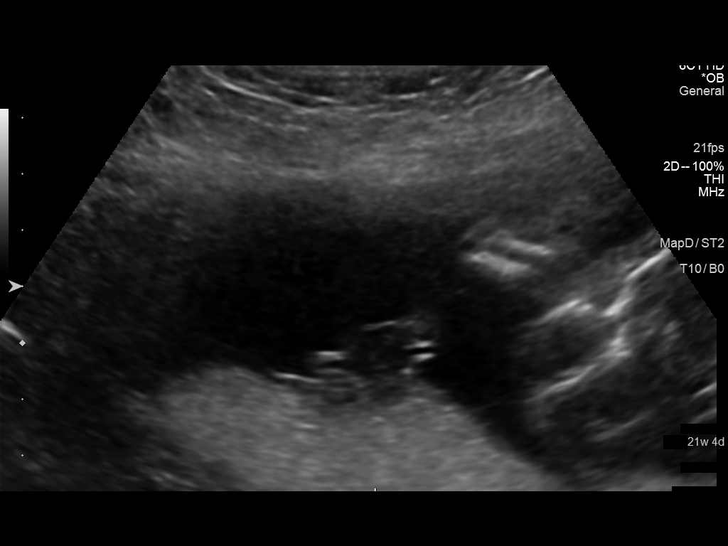
[im 117/122]
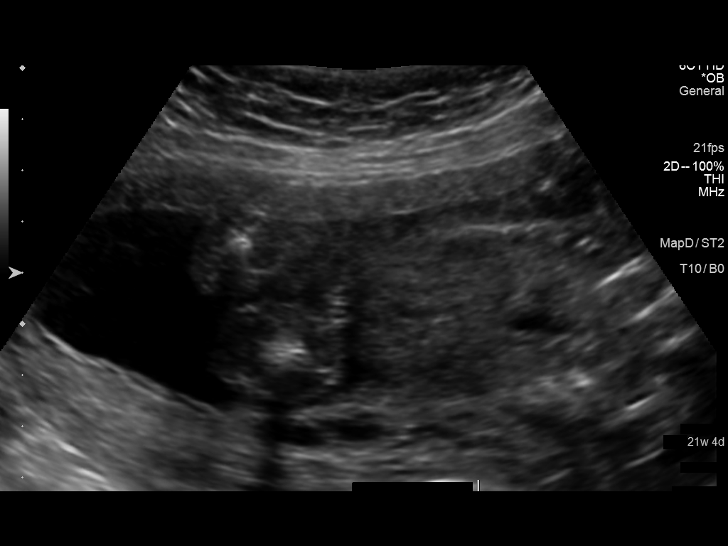

[12 of 28 positions shown; findings below may reference images not displayed]

OBSTETRICS REPORT
                      (Signed Final 01/10/2014 [DATE])

Service(s) Provided

 US OB COMP + 14 WK                                    76805.1
Indications

 Basic anatomic survey                                 z36
 Normal NIPT
 20 weeks gestation of pregnancy
Fetal Evaluation

 Num Of Fetuses:    1
 Fetal Heart Rate:  162                          bpm
 Cardiac Activity:  Observed
 Presentation:      Cephalic
 Placenta:          Posterior, above cervical
                    os
 P. Cord            Visualized, central
 Insertion:

 Amniotic Fluid
 AFI FV:      Subjectively within normal limits
                                             Larg Pckt:     4.5  cm
Biometry

 BPD:     45.7  mm     G. Age:  19w 6d                CI:        69.49   70 - 86
                                                      FL/HC:      18.9   16.8 -

 HC:       175  mm     G. Age:  20w 0d       30  %    HC/AC:      1.21   1.09 -

 AC:     145.2  mm     G. Age:  19w 6d       30  %    FL/BPD:
 FL:        33  mm     G. Age:  20w 2d       43  %    FL/AC:      22.7   20 - 24
 HUM:     29.3  mm     G. Age:  19w 4d       32  %
 CER:     19.9  mm     G. Age:  19w 0d       19  %
 NFT:      2.6  mm

 Est. FW:     329  gm    0 lb 12 oz      43  %
Gestational Age

 LMP:           21w 4d        Date:  08/12/13                 EDD:   05/19/14
 U/S Today:     20w 0d                                        EDD:   05/30/14
 Best:          20w 2d     Det. By:  Previous Ultrasound      EDD:   05/28/14
Anatomy

 Cranium:          Appears normal         Aortic Arch:      Appears normal
 Fetal Cavum:      Appears normal         Ductal Arch:      Not well visualized
 Ventricles:       Appears normal         Diaphragm:        Appears normal
 Choroid Plexus:   Appears normal         Stomach:          Appears normal, left
                                                            sided
 Cerebellum:       Appears normal         Abdomen:          Appears normal
 Posterior Fossa:  Appears normal         Abdominal Wall:   Appears nml (cord
                                                            insert, abd wall)
 Nuchal Fold:      Appears normal         Cord Vessels:     Appears normal (3
                                                            vessel cord)
 Face:             Appears normal         Kidneys:          Appear normal
                   (orbits and profile)
 Lips:             Appears normal         Bladder:          Appears normal
 Heart:            Appears normal         Spine:            Appears normal
                   (4CH, axis, and
                   situs)
 RVOT:             Appears normal         Lower             Appears normal
                                          Extremities:
 LVOT:             Appears normal         Upper             Appears normal
                                          Extremities:

 Other:  Nasal bone visualized. Heels visualized. Female gender.
Targeted Anatomy

 Fetal Central Nervous System
 Lat. Ventricles:  .54                    Cisterna Magna:   .45
Cervix Uterus Adnexa

 Cervical Length:    4.8      cm

 Cervix:       Normal appearance by transabdominal scan.

 Left Ovary:    Within normal limits.
 Right Ovary:   Within normal limits.
Impression

 SIUP at 20+2 weeks
 Normal detailed fetal anatomy; limited views of DA
 Markers of aneuploidy: none
 Normal amniotic fluid volume
 Measurements consistent with prior US
Recommendations

 Follow-up as clinically indicated

 questions or concerns.

## 2017-01-10 IMAGING — US US TRANSVAGINAL NON-OB
1 series · 14 of 25 positions shown · non-contrast
Comparison: None applicable

CLINICAL DATA: Malpositioned intrauterine device. IUD placed in
May 2014. Mirena IUD. LMP 06/10/2015.

EXAM:
ULTRASOUND PELVIS TRANSVAGINAL
TECHNIQUE: Transvaginal ultrasound examination of the pelvis was performed
including evaluation of the uterus, ovaries, adnexal regions, and
pelvic cul-de-sac.

[Series 1: us transvaginal non-ob · 0.12mm/px · 14 of 63 slices shown]
[im 1/63]
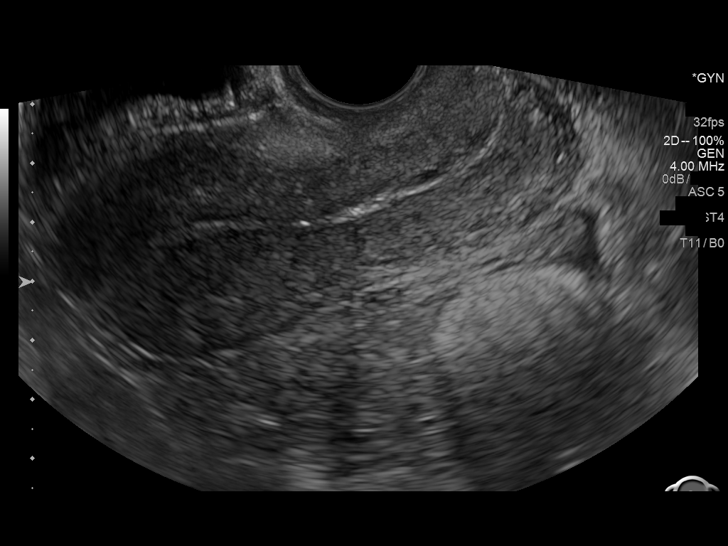
[im 6/63]
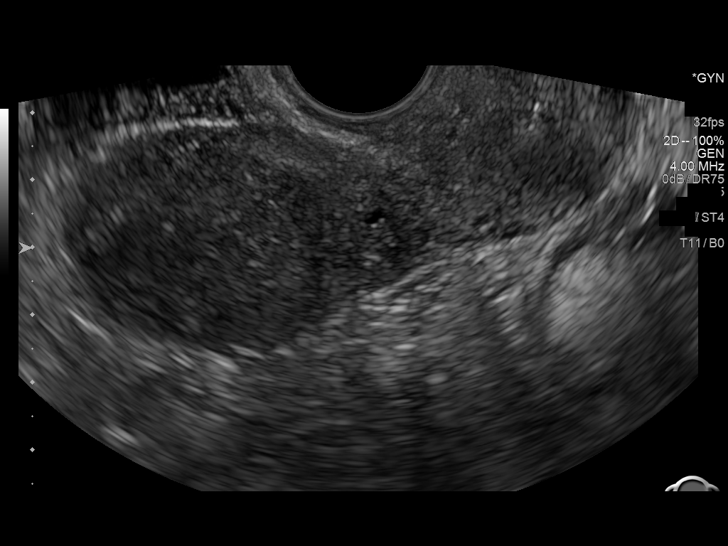
[im 11/63]
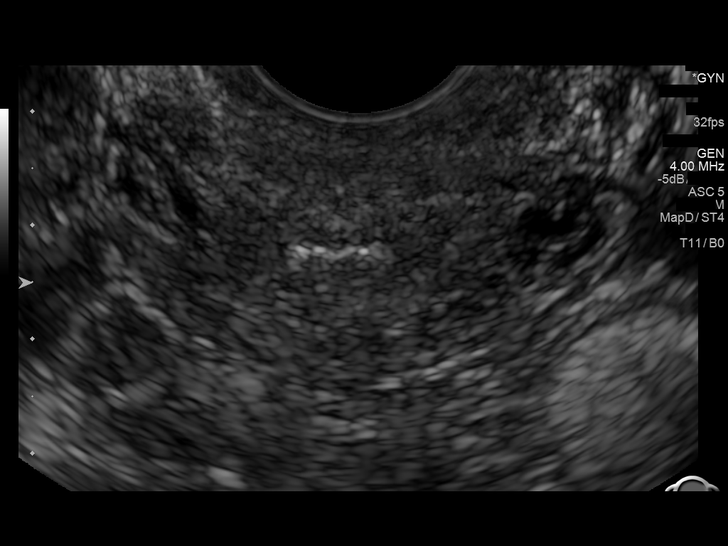
[im 16/63]
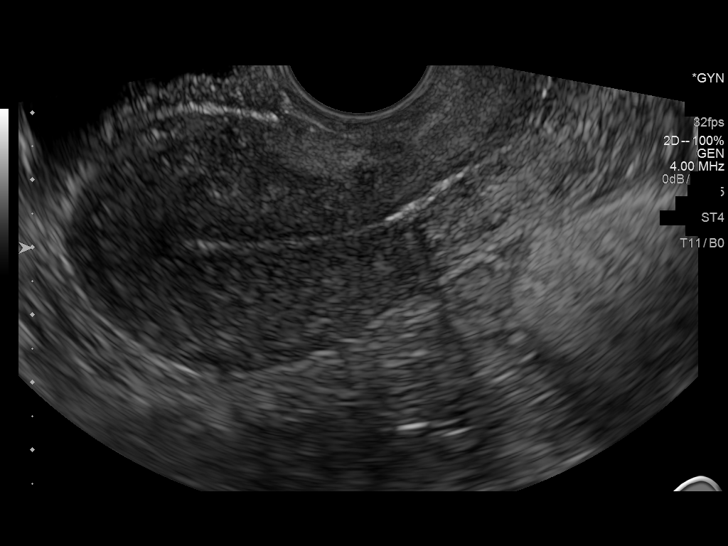
[im 21/63]
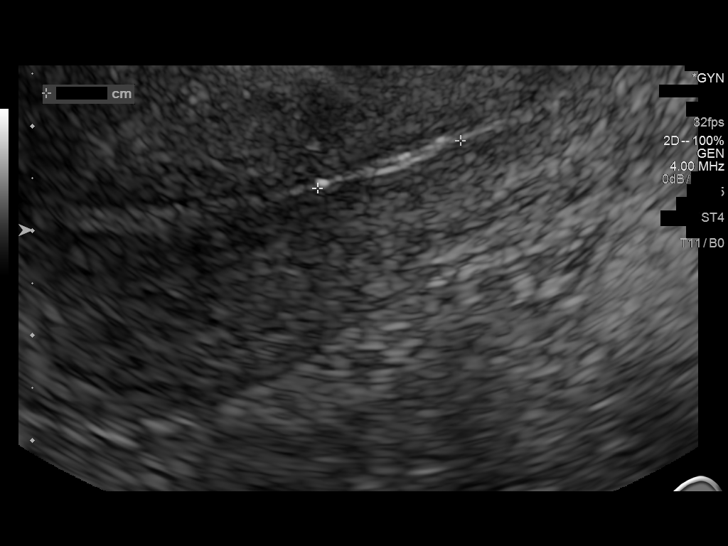
[im 24/63]
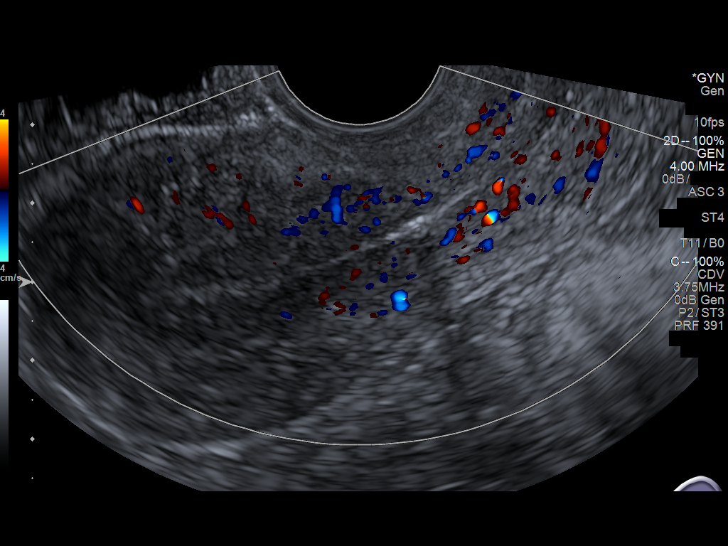
[im 29/63]
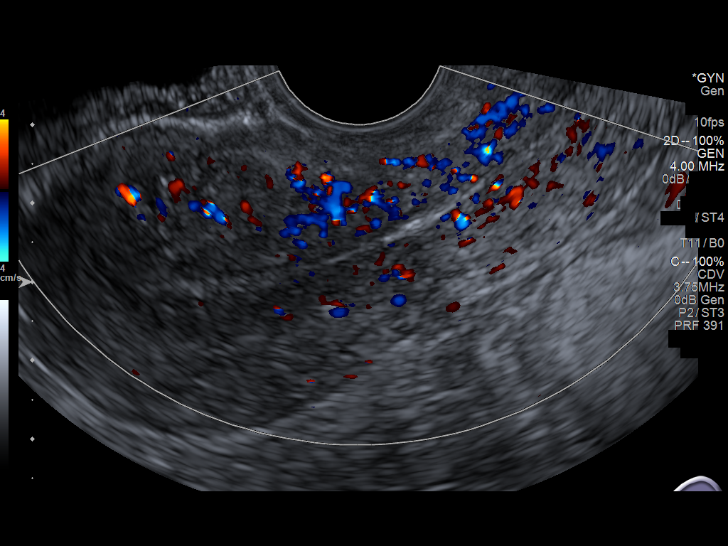
[im 34/63]
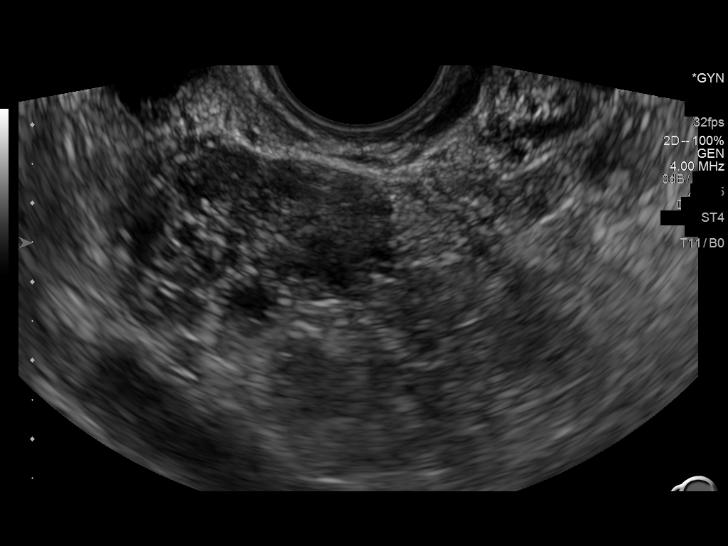
[im 39/63]
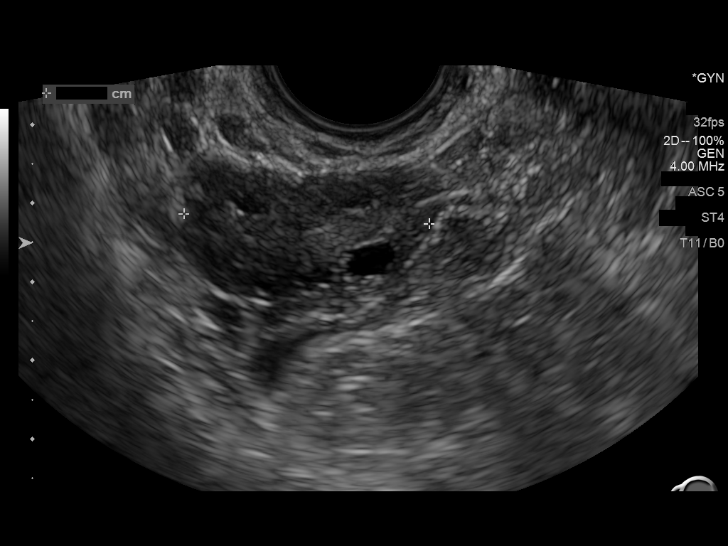
[im 42/63]
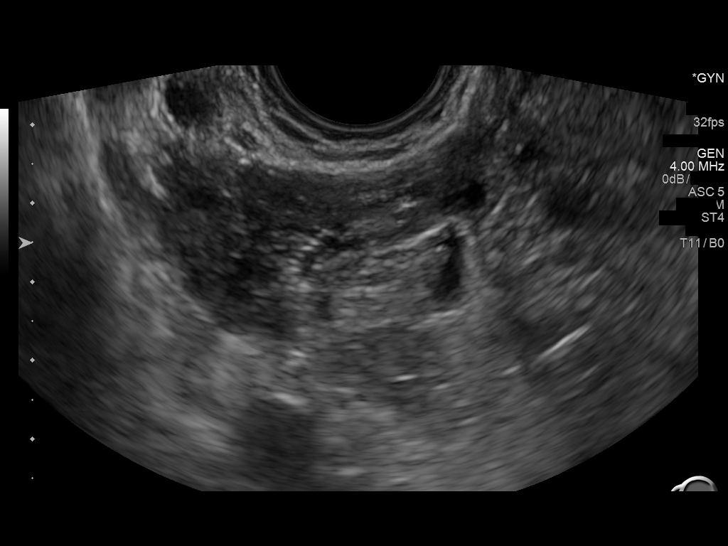
[im 47/63]
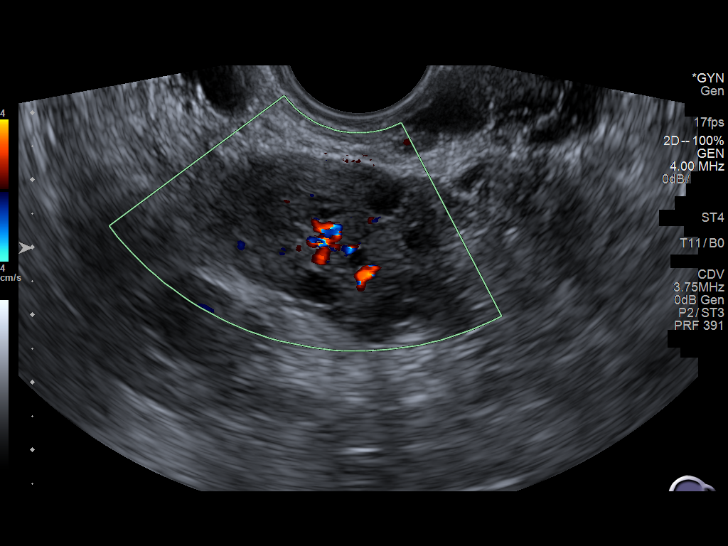
[im 52/63]
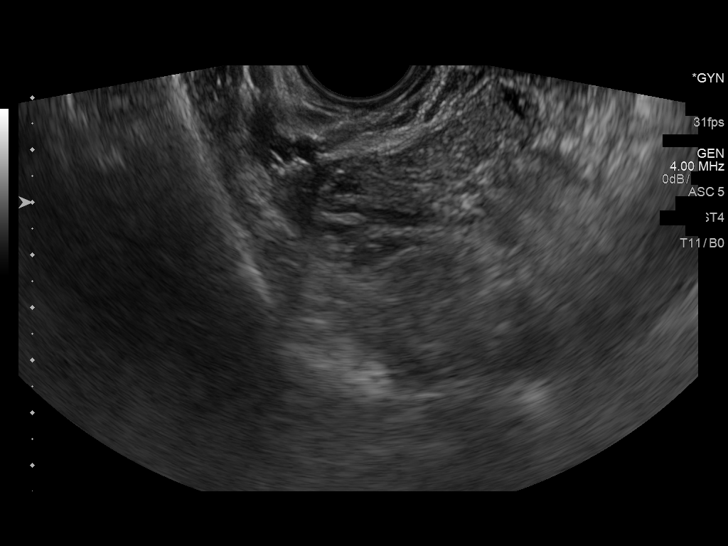
[im 57/63]
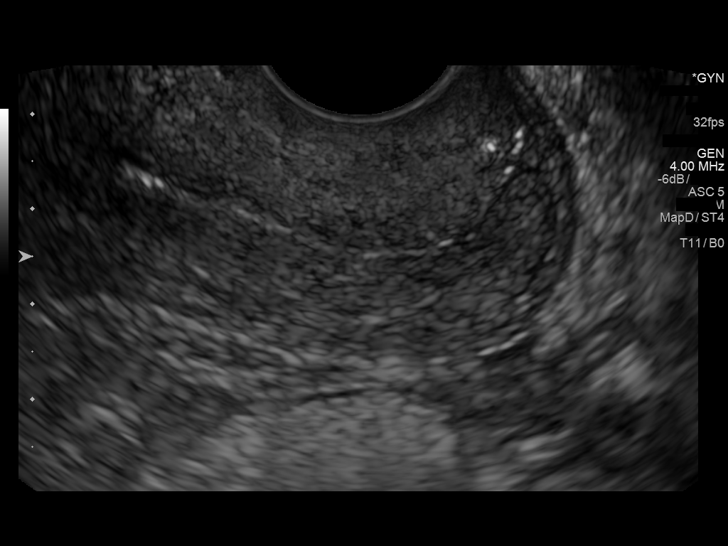
[im 63/63]
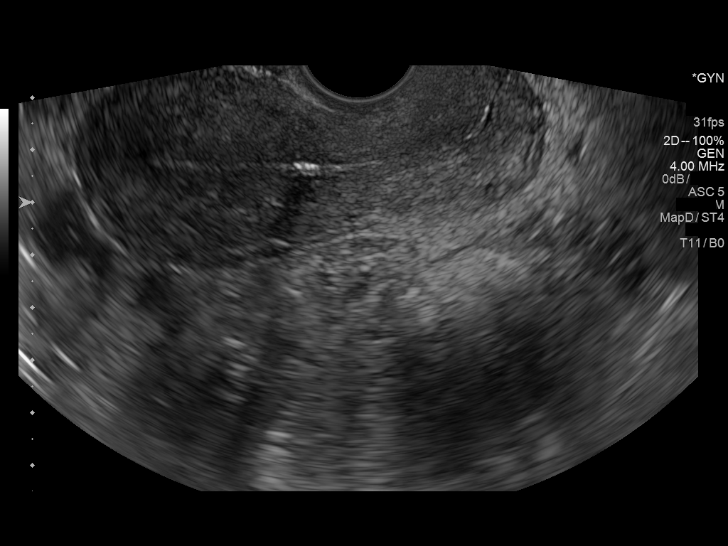

[14 of 25 positions shown; findings below may reference images not displayed]

FINDINGS: Uterus

Measurements: 9.0 x 3.8 x 4.5 cm. No fibroids or other mass
visualized.

Endometrium

Thickness: 2.2 mm. The upper endometrial canal is normal in
appearance. The lower endometrial canal is remarkable for echogenic
appearance, associated with posterior acoustic shadowing, likely
representing the intrauterine device, lower than expected. No
evidence that a portion of the IUD is in the myometrium.

Right ovary

Measurements: 3.2 x 2.1 x 3.1 cm. Normal appearance/no adnexal mass.

Left ovary

Measurements: 3.7 x 2.4 x 3.2 cm. Normal appearance/no adnexal mass.

Other findings:  No abnormal free fluid
IMPRESSION: 1. Suspect that the intrauterine device is within the lower uterine
segment/endocervical canal.
2. Normal appearance of the ovaries.
